# Patient Record
Sex: Male | Born: 1945 | Race: White | Hispanic: No | Marital: Married | Smoking: Never smoker
Health system: Southern US, Community
[De-identification: ages and names within clinical notes are randomized; demographics above are authoritative.]

## PROBLEM LIST (undated history)

## (undated) DIAGNOSIS — I480 Paroxysmal atrial fibrillation: Secondary | ICD-10-CM

## (undated) DIAGNOSIS — E785 Hyperlipidemia, unspecified: Secondary | ICD-10-CM

## (undated) DIAGNOSIS — I10 Essential (primary) hypertension: Secondary | ICD-10-CM

## (undated) DIAGNOSIS — IMO0001 Reserved for inherently not codable concepts without codable children: Secondary | ICD-10-CM

## (undated) DIAGNOSIS — G4730 Sleep apnea, unspecified: Secondary | ICD-10-CM

## (undated) DIAGNOSIS — D6851 Activated protein C resistance: Secondary | ICD-10-CM

## (undated) HISTORY — PX: HERNIA REPAIR: SHX51

## (undated) HISTORY — PX: PACEMAKER INSERTION: SHX728

---

## 2003-08-10 ENCOUNTER — Ambulatory Visit (HOSPITAL_COMMUNITY): Admission: RE | Admit: 2003-08-10 | Discharge: 2003-08-10 | Payer: Self-pay | Admitting: Family Medicine

## 2005-09-03 ENCOUNTER — Ambulatory Visit (HOSPITAL_BASED_OUTPATIENT_CLINIC_OR_DEPARTMENT_OTHER): Admission: RE | Admit: 2005-09-03 | Discharge: 2005-09-03 | Payer: Self-pay | Admitting: Family Medicine

## 2005-09-24 ENCOUNTER — Ambulatory Visit: Payer: Self-pay | Admitting: Internal Medicine

## 2006-07-04 ENCOUNTER — Ambulatory Visit (HOSPITAL_BASED_OUTPATIENT_CLINIC_OR_DEPARTMENT_OTHER): Admission: RE | Admit: 2006-07-04 | Discharge: 2006-07-04 | Payer: Self-pay | Admitting: Family Medicine

## 2006-07-08 ENCOUNTER — Ambulatory Visit: Payer: Self-pay | Admitting: Internal Medicine

## 2013-02-23 HISTORY — PX: CARDIAC CATHETERIZATION: SHX172

## 2013-03-04 ENCOUNTER — Inpatient Hospital Stay (HOSPITAL_COMMUNITY)
Admission: AD | Admit: 2013-03-04 | Discharge: 2013-03-08 | DRG: 243 | Disposition: A | Discharge status: Home / Self Care | Payer: Medicare Other | Attending: Cardiovascular Disease | Admitting: Cardiovascular Disease

## 2013-03-04 ENCOUNTER — Ambulatory Visit (HOSPITAL_COMMUNITY): Admit: 2013-03-04 | Payer: Self-pay | Admitting: Cardiovascular Disease

## 2013-03-04 ENCOUNTER — Encounter (HOSPITAL_COMMUNITY): Payer: Self-pay | Admitting: Physician Assistant

## 2013-03-04 ENCOUNTER — Encounter (HOSPITAL_COMMUNITY)
Admission: AD | Disposition: A | Discharge status: Home / Self Care | Payer: Self-pay | Source: Home / Self Care | Attending: Cardiovascular Disease

## 2013-03-04 ENCOUNTER — Encounter (HOSPITAL_COMMUNITY): Payer: Self-pay

## 2013-03-04 DIAGNOSIS — R079 Chest pain, unspecified: Secondary | ICD-10-CM

## 2013-03-04 DIAGNOSIS — I10 Essential (primary) hypertension: Secondary | ICD-10-CM | POA: Diagnosis present

## 2013-03-04 DIAGNOSIS — D682 Hereditary deficiency of other clotting factors: Secondary | ICD-10-CM | POA: Diagnosis present

## 2013-03-04 DIAGNOSIS — I4891 Unspecified atrial fibrillation: Secondary | ICD-10-CM | POA: Diagnosis present

## 2013-03-04 DIAGNOSIS — IMO0001 Reserved for inherently not codable concepts without codable children: Secondary | ICD-10-CM

## 2013-03-04 DIAGNOSIS — Z950 Presence of cardiac pacemaker: Secondary | ICD-10-CM

## 2013-03-04 DIAGNOSIS — E785 Hyperlipidemia, unspecified: Secondary | ICD-10-CM | POA: Diagnosis present

## 2013-03-04 DIAGNOSIS — R071 Chest pain on breathing: Secondary | ICD-10-CM

## 2013-03-04 DIAGNOSIS — I200 Unstable angina: Secondary | ICD-10-CM | POA: Diagnosis present

## 2013-03-04 DIAGNOSIS — I495 Sick sinus syndrome: Principal | ICD-10-CM | POA: Diagnosis present

## 2013-03-04 DIAGNOSIS — I480 Paroxysmal atrial fibrillation: Secondary | ICD-10-CM

## 2013-03-04 DIAGNOSIS — I4892 Unspecified atrial flutter: Secondary | ICD-10-CM | POA: Diagnosis present

## 2013-03-04 DIAGNOSIS — D6859 Other primary thrombophilia: Secondary | ICD-10-CM | POA: Diagnosis present

## 2013-03-04 HISTORY — DX: Hyperlipidemia, unspecified: E78.5

## 2013-03-04 HISTORY — DX: Essential (primary) hypertension: I10

## 2013-03-04 HISTORY — DX: Reserved for inherently not codable concepts without codable children: IMO0001

## 2013-03-04 HISTORY — DX: Paroxysmal atrial fibrillation: I48.0

## 2013-03-04 HISTORY — DX: Sleep apnea, unspecified: G47.30

## 2013-03-04 HISTORY — DX: Activated protein C resistance: D68.51

## 2013-03-04 HISTORY — PX: LEFT HEART CATHETERIZATION WITH CORONARY ANGIOGRAM: SHX5451

## 2013-03-04 LAB — COMPREHENSIVE METABOLIC PANEL
ALT: 22 U/L (ref 0–53)
AST: 21 U/L (ref 0–37)
Albumin: 3.1 g/dL — ABNORMAL LOW (ref 3.5–5.2)
Alkaline Phosphatase: 44 U/L (ref 39–117)
BUN: 16 mg/dL (ref 6–23)
CO2: 19 mEq/L (ref 19–32)
Calcium: 7.7 mg/dL — ABNORMAL LOW (ref 8.4–10.5)
Chloride: 110 mEq/L (ref 96–112)
Creatinine, Ser: 0.91 mg/dL (ref 0.50–1.35)
GFR calc Af Amer: >90 mL/min (ref >90)
GFR calc non Af Amer: 86 mL/min — ABNORMAL LOW (ref >90)
Glucose, Bld: 121 mg/dL — ABNORMAL HIGH (ref 70–99)
Potassium: 3.8 mEq/L (ref 3.5–5.1)
Sodium: 138 mEq/L (ref 135–145)
Total Bilirubin: 0.5 mg/dL (ref 0.3–1.2)
Total Protein: 5.8 g/dL — ABNORMAL LOW (ref 6.0–8.3)

## 2013-03-04 LAB — CBC
HCT: 40.6 % (ref 39.0–52.0)
Hemoglobin: 13.8 g/dL (ref 13.0–17.0)
MCH: 30.4 pg (ref 26.0–34.0)
MCHC: 34 g/dL (ref 30.0–36.0)
MCV: 89.4 fL (ref 78.0–100.0)
Platelets: 228 10*3/uL (ref 150–400)
RBC: 4.54 MIL/uL (ref 4.22–5.81)
RDW: 14.4 % (ref 11.5–15.5)
WBC: 13.3 10*3/uL — ABNORMAL HIGH (ref 4.0–10.5)

## 2013-03-04 LAB — TSH: TSH: 0.953 u[IU]/mL (ref 0.350–4.500)

## 2013-03-04 LAB — MRSA PCR SCREENING: MRSA by PCR: NEGATIVE

## 2013-03-04 LAB — POCT I-STAT, CHEM 8
Calcium, Ion: 1.13 mmol/L (ref 1.13–1.30)
Chloride: 110 mEq/L (ref 96–112)
Creatinine, Ser: 0.9 mg/dL (ref 0.50–1.35)
Glucose, Bld: 112 mg/dL — ABNORMAL HIGH (ref 70–99)
HCT: 42 % (ref 39.0–52.0)
Hemoglobin: 14.3 g/dL (ref 13.0–17.0)
Potassium: 3.9 mEq/L (ref 3.5–5.1)

## 2013-03-04 LAB — CK TOTAL AND CKMB (NOT AT ARMC)
CK, MB: 2.4 ng/mL (ref 0.3–4.0)
Relative Index: INVALID (ref 0.0–2.5)
Total CK: 61 U/L (ref 7–232)

## 2013-03-04 LAB — LIPID PANEL
Cholesterol: 112 mg/dL (ref 0–200)
HDL: 35 mg/dL — ABNORMAL LOW (ref >39)
LDL Cholesterol: 65 mg/dL (ref 0–99)
Total CHOL/HDL Ratio: 3.2 RATIO
Triglycerides: 60 mg/dL (ref <150)
VLDL: 12 mg/dL (ref 0–40)

## 2013-03-04 LAB — HEMOGLOBIN A1C
Hgb A1c MFr Bld: 5.3 % (ref <5.7)
Mean Plasma Glucose: 105 mg/dL (ref <117)

## 2013-03-04 LAB — D-DIMER, QUANTITATIVE: D-Dimer, Quant: <0.27 ug/mL-FEU (ref 0.00–0.48)

## 2013-03-04 LAB — TROPONIN I
Troponin I: <0.3 ng/mL (ref <0.30)
Troponin I: <0.3 ng/mL (ref <0.30)
Troponin I: <0.3 ng/mL (ref <0.30)

## 2013-03-04 LAB — APTT: aPTT: 38 seconds — ABNORMAL HIGH (ref 24–37)

## 2013-03-04 LAB — PROTIME-INR
INR: 2.1 — ABNORMAL HIGH (ref 0.00–1.49)
Prothrombin Time: 22.7 seconds — ABNORMAL HIGH (ref 11.6–15.2)

## 2013-03-04 SURGERY — LEFT HEART CATHETERIZATION WITH CORONARY ANGIOGRAM
Anesthesia: LOCAL

## 2013-03-04 MED ORDER — DILTIAZEM HCL 100 MG IV SOLR
5.0000 mg/h | INTRAVENOUS | Status: DC
  Filled 2013-03-04: qty 100

## 2013-03-04 MED ORDER — VERAPAMIL HCL 2.5 MG/ML IV SOLN
INTRAVENOUS | Status: AC
  Filled 2013-03-04: qty 2

## 2013-03-04 MED ORDER — SODIUM CHLORIDE 0.9 % IV SOLN
INTRAVENOUS | Status: AC
  Administered 2013-03-04: 10:00:00 via INTRAVENOUS

## 2013-03-04 MED ORDER — FLECAINIDE ACETATE 50 MG PO TABS
50.0000 mg | ORAL_TABLET | Freq: Two times a day (BID) | ORAL | Status: DC
  Administered 2013-03-04: 50 mg via ORAL
  Filled 2013-03-04 (×3): qty 1

## 2013-03-04 MED ORDER — SODIUM CHLORIDE 0.9 % IV SOLN
INTRAVENOUS | Status: AC
  Administered 2013-03-04: 10 mL/h via INTRAVENOUS

## 2013-03-04 MED ORDER — NITROGLYCERIN 0.4 MG SL SUBL: 0.4000 mg | SUBLINGUAL_TABLET | SUBLINGUAL | Status: DC | PRN

## 2013-03-04 MED ORDER — ACETAMINOPHEN 325 MG PO TABS: 650.0000 mg | ORAL_TABLET | ORAL | Status: DC | PRN

## 2013-03-04 MED ORDER — DILTIAZEM HCL 100 MG IV SOLR
5.0000 mg/h | INTRAVENOUS | Status: DC
  Administered 2013-03-04: 5 mg/h via INTRAVENOUS
  Filled 2013-03-04: qty 100

## 2013-03-04 MED ORDER — KETOROLAC TROMETHAMINE 30 MG/ML IJ SOLN: 15.0000 mg | Freq: Four times a day (QID) | INTRAMUSCULAR | Status: DC | PRN

## 2013-03-04 MED ORDER — FENOFIBRATE 160 MG PO TABS
160.0000 mg | ORAL_TABLET | Freq: Every day | ORAL | Status: DC
  Administered 2013-03-04 – 2013-03-07 (×3): 160 mg via ORAL
  Filled 2013-03-04 (×6): qty 1

## 2013-03-04 MED ORDER — ASPIRIN 300 MG RE SUPP
300.0000 mg | RECTAL | Status: AC
  Filled 2013-03-04: qty 1

## 2013-03-04 MED ORDER — ONDANSETRON HCL 4 MG/2ML IJ SOLN: 4.0000 mg | Freq: Four times a day (QID) | INTRAMUSCULAR | Status: DC | PRN

## 2013-03-04 MED ORDER — ASPIRIN 81 MG PO CHEW: 324.0000 mg | CHEWABLE_TABLET | ORAL | Status: AC

## 2013-03-04 MED ORDER — RIVAROXABAN 20 MG PO TABS
20.0000 mg | ORAL_TABLET | Freq: Every day | ORAL | Status: DC
  Administered 2013-03-04 – 2013-03-07 (×3): 20 mg via ORAL
  Filled 2013-03-04 (×5): qty 1

## 2013-03-04 MED ORDER — METOPROLOL TARTRATE 25 MG PO TABS
75.0000 mg | ORAL_TABLET | Freq: Two times a day (BID) | ORAL | Status: DC
  Administered 2013-03-04 – 2013-03-06 (×5): 75 mg via ORAL
  Filled 2013-03-04 (×8): qty 1

## 2013-03-04 MED ORDER — HEPARIN (PORCINE) IN NACL 2-0.9 UNIT/ML-% IJ SOLN
INTRAMUSCULAR | Status: AC
  Filled 2013-03-04: qty 1000

## 2013-03-04 MED ORDER — LIDOCAINE HCL (PF) 1 % IJ SOLN
INTRAMUSCULAR | Status: AC
  Filled 2013-03-04: qty 30

## 2013-03-04 MED ORDER — SIMVASTATIN 40 MG PO TABS
40.0000 mg | ORAL_TABLET | Freq: Every day | ORAL | Status: DC
  Administered 2013-03-04 – 2013-03-07 (×3): 40 mg via ORAL
  Filled 2013-03-04 (×5): qty 1

## 2013-03-04 NOTE — H&P (Signed)
Patrick Mccall is an 67 y.o. male.   Chief Complaint:  CP HPI: The patient is a 67 yo male with a history of Factor V Leiden deficiency, PAF, HLD, HTN.   He has been taking Xarelto since Aug 2013.  He exercises five times per week.  The patient had developed CP yesterday which was worse with deep inspiration and progressed to a point when it hurt with any breath.  When EMS arrived he was in Afib/flutter with a rapid VR.  EKG showed J-Point elevation and he was taken emergently to the cath lab.   He denies SOB, N, V, fever, diaphoresis, LEE, dizziness, abd pain.  Past Medical History  Diagnosis Date  . PAF (paroxysmal atrial fibrillation)   . Factor V Leiden deficiency   . HLD (hyperlipidemia)     No past surgical history on file.  Family History  Problem Relation Age of Onset  . Heart attack Mother   . Heart attack Father   . Heart attack Maternal Grandmother   . Heart attack Maternal Grandfather   . Heart attack Paternal Grandmother   . Heart attack Paternal Grandfather    Social History:  reports that he has never smoked. He does not have any smokeless tobacco history on file. He reports that he drinks about 0.5 ounces of alcohol per week. His drug history is not on file.  Allergies: No Known Allergies  Medications Prior to Admission  Medication Sig Dispense Refill  . acetaminophen (TYLENOL) 500 MG tablet Take 500 mg by mouth every 6 (six) hours as needed for pain.      . fenofibrate (TRICOR) 145 MG tablet Take 145 mg by mouth daily.      . Fish Oil-Cholecalciferol (FISH OIL + D3) 1200-1000 MG-UNIT CAPS Take 1,000 mg by mouth daily.      . metoprolol (LOPRESSOR) 50 MG tablet Take 50 mg by mouth 2 (two) times daily.      . Multiple Vitamins-Minerals (MULTIVITAMIN WITH MINERALS) tablet Take 1 tablet by mouth daily.      . Rivaroxaban (XARELTO) 20 MG TABS Take 20 mg by mouth daily with supper.      . simvastatin (ZOCOR) 40 MG tablet Take 40 mg by mouth every evening.         Results for orders placed during the hospital encounter of 03/04/13 (from the past 48 hour(s))  CBC     Status: Abnormal   Collection Time    03/04/13  8:00 AM      Result Value Range   WBC 13.3 (*) 4.0 - 10.5 K/uL   RBC 4.54  4.22 - 5.81 MIL/uL   Hemoglobin 13.8  13.0 - 17.0 g/dL   HCT 40.9  81.1 - 91.4 %   MCV 89.4  78.0 - 100.0 fL   MCH 30.4  26.0 - 34.0 pg   MCHC 34.0  30.0 - 36.0 g/dL   RDW 78.2  95.6 - 21.3 %   Platelets 228  150 - 400 K/uL   No results found.  Review of Systems  Constitutional: Negative for fever and diaphoresis.  HENT: Negative for congestion and sore throat.   Respiratory: Negative for shortness of breath.   Cardiovascular: Positive for chest pain. Negative for orthopnea, leg swelling and PND.  Gastrointestinal: Negative for nausea and vomiting.  Genitourinary: Negative for hematuria.  Musculoskeletal: Negative for myalgias.  Neurological: Negative for dizziness.  All other systems reviewed and are negative.    Temperature 98 F (36.7 C), temperature  source Oral, height 5\' 3"  (1.6 m), weight 139 lb 12.4 oz (63.4 kg). Physical Exam  Constitutional: He is oriented to person, place, and time. He appears well-developed and well-nourished. No distress.  HENT:  Head: Normocephalic and atraumatic.  Eyes: EOM are normal. Pupils are equal, round, and reactive to light.  Neck: Normal range of motion. Neck supple. No JVD present.  Cardiovascular: Normal rate, S1 normal and S2 normal.   No murmur heard. Pulses:      Radial pulses are 2+ on the left side.       Dorsalis pedis pulses are 2+ on the right side, and 2+ on the left side.  No carotid bruit.  TR band on right wrist.  Respiratory: Effort normal and breath sounds normal. No respiratory distress. He has no wheezes. He has no rales.  GI: Soft. Bowel sounds are normal. He exhibits no distension. There is no tenderness.  Musculoskeletal: He exhibits no edema.  Neurological: He is alert and  oriented to person, place, and time.  Skin: Skin is warm and dry.  Psychiatric: He has a normal mood and affect.     Assessment/Plan Principal Problem:   Chest pain on respiration Active Problems:   PAF (paroxysmal atrial fibrillation)   HLD (hyperlipidemia)   Factor V deficiency   HTN (hypertension)  Plan: The patient was taken emergently to the cath lab.  Will start home meds and increase lopressor to 75mg  twice daily.  Check d-dimer.  If positive, CTA of the chest.  CBC, BMET.  Toradol for chest pain.    HAGER, BRYAN 03/04/2013, 8:39 AM    Agree with note written by [facility]  PA flutter with atypical CP and anterior precordial j point elevation. STEMI was called. Pt presented to cath lab for urgent cath.  Runell Gess 03/04/2013 5:35 PM

## 2013-03-04 NOTE — H&P (Signed)
    Pt was reexamined and existing H & P reviewed. No changes found.  Runell Gess, MD [facility] 03/04/2013 6:59 AM

## 2013-03-04 NOTE — Progress Notes (Signed)
Huey Bienenstock, PA notified patients increased HR 150-160's, a flutter, with SHOB. New orders received.   Dawson Bills, RN

## 2013-03-04 NOTE — Progress Notes (Signed)
Notified Huey Bienenstock, PA of pt's rhythm now a.fib in 90-100's. Pt denies pain at this time.   Dawson Bills, RN

## 2013-03-04 NOTE — Plan of Care (Signed)
Problem: Phase I Progression Outcomes Goal: Anginal pain relieved Outcome: Completed/Met Date Met:  03/04/13 complete

## 2013-03-04 NOTE — Progress Notes (Signed)
Chaplain responded to page from Cath Lab that pt had arrived and wife was in waiting area. Found pt's wife and son in consult room 3. Chaplain provided emotional and spiritual support including words of encouragement and prayer. Pt's wife expressed appreciation for chaplain support.

## 2013-03-04 NOTE — Progress Notes (Signed)
Transferred to room 2922 with belongings, chart, and meds. No complaints at this time. Patients wife accompanied patient during transfer.    Dawson Bills, RN

## 2013-03-04 NOTE — CV Procedure (Signed)
Patrick Mccall is a 67 y.o. male    [id number] LOCATION:  FACILITY: [facility]  PHYSICIAN: Nanetta Batty, M.D. 14-Jul-1946   DATE OF PROCEDURE:  03/04/2013  DATE OF DISCHARGE:   CARDIAC CATHETERIZATION     History obtained from the patient. [name] is a 67 year old Caucasian male patient of Dr. Carrolyn Meiers [facility] in [place] who has a history of A. Fib and was started on Xarelto no one year ago for stroke prophylaxis. He developed chest pain yesterday which has been more pleuritic in nature. He called EMS this morning and when he arrived he was in rapid A. Fib/flutter with what appeared to be J-point elevation in the anterior precordial leads. Because of this a STEMI was called and the patient arrived by EMS for urgent cardiac catheterization.   PROCEDURE DESCRIPTION:    The patient was brought to the second floor [facility] lab in the postabsorptive state. He was not premedicated with. His right wrist was prepped and shaved in usual sterile fashion. Xylocaine 1% was used for local anesthesia. A 6 French sheath was inserted into the right radial  artery using standard Seldinger technique.the patient did not receive IV heparin bolus since he had taken his Xarelto  last evening. A 6 [place] TIG catheter and pigtail catheters were used for selective poor angiography and left ventriculography respectively. Visipaque was used for the entirety of the case. Retrograde aorta, left ventricular and pullback pressures were recorded.   HEMODYNAMICS:    AO SYSTOLIC/AO DIASTOLIC: 101/77   LV SYSTOLIC/LV DIASTOLIC: 103/25  ANGIOGRAPHIC RESULTS:   1. Left main; normal  2. LAD; normal 3. Left circumflex; codominant and normal.  4. Right coronary artery; codominant and normal 5. Left ventriculography; RAO left ventriculogram was performed using  25 mL of Visipaque dye at 12 mL/second. The overall LVEF estimated  60 %Without wall motion abnormalities  IMPRESSION:[name] had paroxysmal  atrial flutter that converted to sinus rhythm at the end of the case. He has essentially normal coronary arteries and normal left ventricular function. The sheath was removed and  A TR band is placed on the right wrist to chief patent hemostasis.His Xarelto will  be continued as will as  his current medications. His beta blocker will be adjusted. He will be observed in the step down unit overnight and his enzymes will be cycled. He left the Cath Lab in stable condition.  Runell Gess MD, [facility] 03/04/2013 7:33 AM

## 2013-03-05 ENCOUNTER — Encounter (HOSPITAL_COMMUNITY): Payer: Self-pay | Admitting: Anesthesiology

## 2013-03-05 DIAGNOSIS — I4891 Unspecified atrial fibrillation: Secondary | ICD-10-CM

## 2013-03-05 DIAGNOSIS — I4892 Unspecified atrial flutter: Secondary | ICD-10-CM

## 2013-03-05 DIAGNOSIS — D682 Hereditary deficiency of other clotting factors: Secondary | ICD-10-CM

## 2013-03-05 DIAGNOSIS — R071 Chest pain on breathing: Secondary | ICD-10-CM

## 2013-03-05 MED ORDER — ETOMIDATE 2 MG/ML IV SOLN
INTRAVENOUS | Status: AC
  Filled 2013-03-05: qty 20

## 2013-03-05 MED ORDER — LIDOCAINE HCL (CARDIAC) 20 MG/ML IV SOLN
INTRAVENOUS | Status: AC
  Filled 2013-03-05: qty 5

## 2013-03-05 MED ORDER — DOPAMINE-DEXTROSE 3.2-5 MG/ML-% IV SOLN
INTRAVENOUS | Status: AC
  Administered 2013-03-05: 3 ug/kg/min via INTRAVENOUS
  Filled 2013-03-05: qty 250

## 2013-03-05 MED ORDER — FLUMAZENIL 0.5 MG/5ML IV SOLN
INTRAVENOUS | Status: AC
  Filled 2013-03-05: qty 5

## 2013-03-05 MED ORDER — ROCURONIUM BROMIDE 50 MG/5ML IV SOLN
INTRAVENOUS | Status: AC
  Filled 2013-03-05: qty 2

## 2013-03-05 MED ORDER — SODIUM CHLORIDE 0.9 % IV SOLN: 250.0000 mL | INTRAVENOUS | Status: DC

## 2013-03-05 MED ORDER — SODIUM CHLORIDE 0.9 % IJ SOLN
3.0000 mL | Freq: Two times a day (BID) | INTRAMUSCULAR | Status: DC
  Administered 2013-03-05 – 2013-03-08 (×7): 3 mL via INTRAVENOUS

## 2013-03-05 MED ORDER — SODIUM CHLORIDE 0.9 % IJ SOLN: 3.0000 mL | INTRAMUSCULAR | Status: DC | PRN

## 2013-03-05 MED ORDER — SUCCINYLCHOLINE CHLORIDE 20 MG/ML IJ SOLN
INTRAMUSCULAR | Status: AC
  Filled 2013-03-05: qty 1

## 2013-03-05 MED ORDER — DOPAMINE-DEXTROSE 3.2-5 MG/ML-% IV SOLN: 3.0000 ug/kg/min | INTRAVENOUS | Status: DC

## 2013-03-05 NOTE — Progress Notes (Signed)
Pts ST Elevation subsided and pt no longer feels lightheaded. New order obtained for an echo in the AM. Gwynneth Macleod RNBSN

## 2013-03-05 NOTE — Progress Notes (Signed)
  Echocardiogram 2D Echocardiogram has been performed.  Patrick Mccall 03/05/2013, 3:27 PM

## 2013-03-05 NOTE — Progress Notes (Signed)
Sent page to Dr. Mayford Knife to report that the pt has had several short pauses and a couple long pauses the longest appearing to be 7 seconds long. Pacing pad placed on pt, and zol at the bedside. No new orders yet obtained. Gwynneth Macleod RN BSN

## 2013-03-05 NOTE — Progress Notes (Signed)
Called Dr. Mayford Knife to report changes in pts EKG, and the pts c/o lightheadedness. 12 lead EKG performed and walked over to Dr. In Cath Lab. Gwynneth Macleod RN BSN

## 2013-03-05 NOTE — Anesthesia Preprocedure Evaluation (Signed)
Anesthesia Evaluation  Patient identified by MRN, date of birth, ID band Patient awake    Reviewed: Allergy & Precautions, H&P , NPO status , Patient's Chart, lab work & pertinent test results  Airway       Dental   Pulmonary sleep apnea ,          Cardiovascular hypertension, + dysrhythmias Atrial Fibrillation     Neuro/Psych    GI/Hepatic   Endo/Other    Renal/GU      Musculoskeletal   Abdominal   Peds  Hematology   Anesthesia Other Findings   Reproductive/Obstetrics                           Anesthesia Physical Anesthesia Plan  ASA: III  Anesthesia Plan: General   Post-op Pain Management:    Induction: Intravenous  Airway Management Planned: Mask  Additional Equipment:   Intra-op Plan:   Post-operative Plan:   Informed Consent: I have reviewed the patients History and Physical, chart, labs and discussed the procedure including the risks, benefits and alternatives for the proposed anesthesia with the patient or authorized representative who has indicated his/her understanding and acceptance.     Plan Discussed with: CRNA, Anesthesiologist and Surgeon  Anesthesia Plan Comments:         Anesthesia Quick Evaluation

## 2013-03-05 NOTE — Consult Note (Signed)
ELECTROPHYSIOLOGY CONSULT NOTE  Patient ID: Patrick Mccall MRN: [id number], DOB/AGE: [birth year]   Admit date: 03/04/2013 Date of Consult: 03/05/2013  Primary Physician: Burnell Blanks, MD Primary Cardiologist: Nanetta Batty, MD Reason for Consultation: Atrial flutter  History of Present Illness Patrick Mccall is a pleasant 67 year old man with PAF, HTN, OSA and factor V Leiden deficiency who presented yesterday with chest pain and abnormal ECG with anterolateral ST elevation. He underwent cardiac catheterization which revealed normal coronary arteries and normal LV function. He also had rapid atrial flutter while in the cath lab. He was started on IV diltiazem in addition to flecainide and metoprolol. He developed bradycardia with pauses, longest was 8 seconds in duration. Most of these have occurred with brief conversion to SR; however, there are a few 4 second pauses while in flutter. IV diltiazem was discontinued today around 3:30 AM and he has not received flecainide or metoprolol today. He has persistent atrial flutter. Echo is pending.  Patrick Mccall reports he was diagnosed with AF 8 years ago. He was followed previously by Dr. Reyes Ivan. He hasn't seen a cardiologist in 5 years. Since then he has had intermittent "racing" palpitations that usually last 5-10 minutes and resolve without intervention. However, his episodes seem to be more frequent in the last few months. He was started on Xarelto in August 2013. He denies SOB/DOE. He denies dizziness, near syncope or syncope. He is quite active, exercising fives times weekly on the elliptical machine and treadmill without difficulty or limitation.   Past Medical History Past Medical History  Diagnosis Date  . PAF (paroxysmal atrial fibrillation)   . Factor V Leiden deficiency   . HLD (hyperlipidemia)   . Hypertension   . Sleep apnea     Past Surgical History Past Surgical History  Procedure Laterality Date  . Hernia repair       Allergies/Intolerances No Known Allergies  Inpatient Medications . fenofibrate  160 mg Oral Daily  . metoprolol tartrate  75 mg Oral BID  . rivaroxaban  20 mg Oral Q supper  . simvastatin  40 mg Oral q1800  . sodium chloride  3 mL Intravenous Q12H   . sodium chloride 10 mL/hr (03/04/13 1946)  . sodium chloride    . diltiazem (CARDIZEM) infusion Stopped (03/05/13 0330)  . DOPamine 3 mcg/kg/min (03/05/13 0546)   Family History Positive for CAD   Social History Social History  . Marital Status: Married   Social History Main Topics  . Smoking status: Never Smoker   . Smokeless tobacco: No  . Alcohol Use: .5 - 1 oz/week    1-2 drink(s) per week  . Drug Use: No   Social History Narrative   Exercises five times per week doing cardio.    Review of Systems General: No chills, fever, night sweats or weight changes  Cardiovascular:  No chest pain, dyspnea on exertion, edema, orthopnea, palpitations, paroxysmal nocturnal dyspnea Dermatological: No rash, lesions or masses Respiratory: No cough, dyspnea Urologic: No hematuria, dysuria Abdominal: No nausea, vomiting, diarrhea, bright red blood per rectum, melena, or hematemesis Neurologic: No visual changes, weakness, changes in mental status All other systems reviewed and are otherwise negative except as noted above.  Physical Exam Blood pressure 116/61, pulse 68, temperature 97.7 F (36.5 C), temperature source Oral, resp. rate 26, height 5\' 3"  (1.6 m), weight 139 lb 12.4 oz (63.4 kg), SpO2 99.00%.  General: Well developed, well appearing 67 year old male in no acute distress. HEENT: Normocephalic, atraumatic. EOMs  intact. Sclera nonicteric. Oropharynx clear.  Neck: Supple without bruits. No JVD. Lungs: Respirations regular and unlabored, CTA bilaterally. No wheezes, rales or rhonchi. Heart: Regular. S1, S2 present. No murmurs, rub, S3 or S4. Abdomen: Soft, non-tender, non-distended. BS present x 4 quadrants. No  hepatosplenomegaly.  Extremities: No clubbing, cyanosis or edema. DP/PT/Radials 2+ and equal bilaterally. Psych: Normal affect. Neuro: Alert and oriented X 3. Moves all extremities spontaneously. Musculoskeletal: No kyphosis. Skin: Intact. Warm and dry. No rashes or petechiae in exposed areas.   Labs  Recent Labs  03/04/13 0800 03/04/13 1000 03/04/13 1556 03/04/13 2240  CKTOTAL 61  --   --   --   CKMB 2.4  --   --   --   TROPONINI <0.30 <0.30 <0.30 <0.30   Lab Results  Component Value Date   WBC 13.3* 03/04/2013   HGB 13.8 03/04/2013   HCT 40.6 03/04/2013   MCV 89.4 03/04/2013   PLT 228 03/04/2013    Recent Labs Lab 03/04/13 0800  NA 138  K 3.8  CL 110  CO2 19  BUN 16  CREATININE 0.91  CALCIUM 7.7*  PROT 5.8*  BILITOT 0.5  ALKPHOS 44  ALT 22  AST 21  GLUCOSE 121*   Lab Results  Component Value Date   CHOL 112 03/04/2013   HDL 35* 03/04/2013   LDLCALC 65 03/04/2013   TRIG 60 03/04/2013   Lab Results  Component Value Date   DDIMER <0.27 03/04/2013    Recent Labs  03/04/13 0800  TSH 0.953    Recent Labs  03/04/13 0800  INR 2.10*    Radiology/Studies No results found.  Echocardiogram  pending  12-lead ECG on presentation (03/04/2013 07:58) shows SR at 82 bpm with anterolateral ST elevation Repeat 12-lead ECG (03/05/2013 01:00) shows typical appearing atrial flutter with 5:1 conduction at 49 bpm  Telemetry shows persistent atrial flutter; intermittent pauses, most on conversion to SR, longest 8 seconds; there are 2 pauses ~4 seconds in duration while in flutter   Assessment and Plan 1. Atrial flutter with intermittent bradycardia and post termination pauses 2.Atrial fibrillation, coarse with an RVR 2. Normal coronary arteries 3. Normal LV function 4. PAF, diagnosed 8 years ago  Patrick Mccall presents with symptomatic, typical appearing atrial flutter and post termination pauses. He also has PAF and this is demonstrated on tele as well as on 12 lead  ECG. Treatment options were reviewed in detail with him and his wife. These include medical therapy with rate/rhythm controlling medications +PPM and/or procedural treatment approach with EPS +RF ablation. Please see Dr. Lubertha Basque recommendations below.    Dr. Ladona Ridgel to see Signed, Rick Duff, PA-C 03/05/2013, 12:58 PM  EP Attending  Patient seen and examined. I have reviewed and amended the note where appropriate. The patient has atrial flutter, mostly, but also atrial fibrillation. He has had pauses which are probably in part associated with anti-arrhythmic drug therapy and cardizem. While catheter ablation of atrial flutter is a strong consideration, he would also need treatment of his atrial fibrillation which would be returning to anti-arrhythmic drug therap and the concern for worsening pauses. I have laid out the options for treatment. One option would be PPM and flecaininde and either a calcium channel or a beta blocker. A second option would be option one and atrial flutter ablation as atrial flutter tends to be refractory to flecainide or for that matter all anti-arrhythmic drugs. I have presented these options to the patient and he is reflecting on  his options.  Leonia Reeves.D.

## 2013-03-05 NOTE — Progress Notes (Signed)
Spoke with Dr. Mayford Knife about pts current rhythm and frequent pauses. New order obtained for dopamine, pacing pads on pt, pt NPO, and all AM meds are on hold. Pt is frequently converting back and forth into a sinus rhythm after the long pauses, and then quickly goes back into a flutter. Currently bp 111/36, HR 77, Resp 25, Sats 100% RA. Gwynneth Macleod RN BSN

## 2013-03-05 NOTE — Progress Notes (Signed)
Came to perform DCCV of Atrial Flutter -- & as I arrived, he converted to NSR that lasted ~5 min before going back into Aflutter with rates 100-150 again.  -- this makes attempting DCCV not a a good idea, as he will likely not maintain NSR.  With pauses - I am reluctant to use combination of Flecainide + CCB for rhythm control.  Will consult EP to discuss other options for rhythm control vs. Aflutter ablation.  Marykay Lex, M.D., M.S. [facility] [address] [place], [place]  [address]  [phone] Pager # [phone] 03/05/2013 10:53 AM

## 2013-03-05 NOTE — Progress Notes (Signed)
[facility]  Subjective: Currently asymptomatic. No complaints.    Objective: Vital signs in last 24 hours: Temp:  [97.8 F (36.6 C)-98.5 F (36.9 C)] 97.8 F (36.6 C) (06/11 0746) Pulse Rate:  [41-168] 90 (06/11 0746) Resp:  [8-30] 24 (06/11 0746) BP: (72-164)/(37-93) 96/51 mmHg (06/11 0746) SpO2:  [92 %-100 %] 98 % (06/11 0746) Weight:  [139 lb 12.4 oz (63.4 kg)] 139 lb 12.4 oz (63.4 kg) (06/11 0500)    Intake/Output from previous day: 06/10 0701 - 06/11 0700 In: 801.5 [P.O.:600; I.V.:201.5] Out: 1000 [Urine:1000] Intake/Output this shift:    Medications Current Facility-Administered Medications  Medication Dose Route Frequency Provider Last Rate Last Dose  . 0.9 %  sodium chloride infusion   Intravenous Continuous Wilburt Finlay, PA-C 10 mL/hr at 03/04/13 1946 10 mL/hr at 03/04/13 1946  . acetaminophen (TYLENOL) tablet 650 mg  650 mg Oral Q4H PRN Runell Gess, MD      . aspirin chewable tablet 324 mg  324 mg Oral NOW Wilburt Finlay, PA-C       Or  . aspirin suppository 300 mg  300 mg Rectal NOW Wilburt Finlay, PA-C      . diltiazem (CARDIZEM) 100 mg in dextrose 5 % 100 mL infusion  5-15 mg/hr Intravenous Titrated Wilburt Finlay, PA-C   2.5 mg/hr at 03/05/13 0310  . DOPamine (INTROPIN) 800 mg in dextrose 5 % 250 mL infusion  3 mcg/kg/min Intravenous Titrated Quintella Reichert, MD 3.6 mL/hr at 03/05/13 0546 3 mcg/kg/min at 03/05/13 0546  . fenofibrate tablet 160 mg  160 mg Oral Daily Wilburt Finlay, PA-C   160 mg at 03/04/13 1000  . flecainide (TAMBOCOR) tablet 50 mg  50 mg Oral Q12H Wilburt Finlay, PA-C   50 mg at 03/04/13 2253  . ketorolac (TORADOL) 30 MG/ML injection 15 mg  15 mg Intravenous Q6H PRN Wilburt Finlay, PA-C      . metoprolol tartrate (LOPRESSOR) tablet 75 mg  75 mg Oral BID Wilburt Finlay, PA-C   75 mg at 03/04/13 2253  . nitroGLYCERIN (NITROSTAT) SL tablet 0.4 mg  0.4 mg Sublingual Q5 Min x 3 PRN Wilburt Finlay, PA-C      . ondansetron [facility])  injection 4 mg  4 mg Intravenous Q6H PRN Runell Gess, MD      . Rivaroxaban [name]) tablet 20 mg  20 mg Oral Q supper Wilburt Finlay, PA-C   20 mg at 03/04/13 1804  . simvastatin (ZOCOR) tablet 40 mg  40 mg Oral q1800 Wilburt Finlay, PA-C   40 mg at 03/04/13 1804    PE: General appearance: alert, cooperative and no distress Lungs: clear to auscultation bilaterally Heart: regularly irregular rhythm Extremities: no LEE Pulses: 2+ and symmetric Skin: warm and dry Neurologic: Grossly normal  Lab Results:   Recent Labs  03/04/13 0732 03/04/13 0800  WBC  --  13.3*  HGB 14.3 13.8  HCT 42.0 40.6  PLT  --  228   BMET  Recent Labs  03/04/13 0732 03/04/13 0800  NA 144 138  K 3.9 3.8  CL 110 110  CO2  --  19  GLUCOSE 112* 121*  BUN 16 16  CREATININE 0.90 0.91  CALCIUM  --  7.7*   PT/INR  Recent Labs  03/04/13 0800  LABPROT 22.7*  INR 2.10*   Cholesterol  Recent Labs  03/04/13 0800  CHOL 112   Cardiac Panel (last 3 results)  Recent Labs  03/04/13 0800 03/04/13 1000  03/04/13 1556 03/04/13 2240  CKTOTAL 61  --   --   --   CKMB 2.4  --   --   --   TROPONINI <0.30 <0.30 <0.30 <0.30  RELINDX RELATIVE INDEX IS INVALID  --   --   --    Studies/Results: Diagnostic LHC 03/04/13 HEMODYNAMICS:  AO SYSTOLIC/AO DIASTOLIC: 101/77  LV SYSTOLIC/LV DIASTOLIC: 103/25  ANGIOGRAPHIC RESULTS:  1. Left main; normal  2. LAD; normal  3. Left circumflex; codominant and normal.  4. Right coronary artery; codominant and normal  5. Left ventriculography; RAO left ventriculogram was performed using  25 mL of Visipaque dye at 12 mL/second. The overall LVEF estimated  60 %Without wall motion abnormalities    Assessment/Plan  Principal Problem:   Chest pain on respiration Active Problems:   PAF (paroxysmal atrial fibrillation)   HLD (hyperlipidemia)   Factor V deficiency   HTN (hypertension)  Plan: Emergent LHC yesterday revealed normal coronaries and normal LV  function. Right radial access site is stable. Pt had several long pauses overnight, the longest appearing to be 7 seconds. Subsequently, he was placed on dopamine and pacer pads were placed. Currently, he continues to have a-flutter with rates fluctuating in the 80-120s. He continues to have intermittent pauses. He is currently asymptomatic, and hemodynamically stable. He has been on Xarelto for [facility]. Will try to attempt DCCV today. If not successful, pt will need a PPM. Dr. Royann Shivers is not in the hospital today. ? temporary pacer for now, if needed. MD to follow.     LOS: 1 day    Brittainy M. Delmer Islam 03/05/2013 8:01 AM  I have seen and evaluated the patient this AM along with Boyce Medici, PA. I agree with her findings, examination as well as impression recommendations.  Interesting scenario - presented with rapid A-flutter that likely potentiated his J-point elevation.  Was take to the cath lab - no significant disease noted.  Was placed on IV Diltiazem & Flecainde - overnight became hypotensive & was noted to have prolonged pauses (~7 sec).  Was placed on low dose Dopamine gtt & now has rates btw 90-150.  He feels a bit better this AM, but not at baseline.    He has been on Xarelto since Aug 2013 for combination of Afib & FActor V Leiden deficiency.   Besides his Aflutter wit variable block (irregular rhythm) & general appearance of "not feeling well", his exam is benign.  At this point, I think that DCCV may be the best course of action -- we will try to arrange this for today with either Anesthesiology or PCCM for sedation & airway management.  Would stop dopamine prior to DCCV  Will consult EP to consider potential Aflutter ablation.  Continue Xarelto & make NPO  I spent at least 15 minutes explaining our findings & plan of action. I explained the risks & benefits of DCCV to the patient & answered all ?s.  We will be standing by for need to temporarily externally pace +/-  Temp PM.    MD Time with pt: 20 min  Kenya Kook W, M.D., M.S. [facility] [address] [place], [place]  [address]  [phone] Pager # [phone] 03/05/2013 8:39 AM

## 2013-03-06 ENCOUNTER — Encounter (HOSPITAL_COMMUNITY)
Admission: AD | Disposition: A | Discharge status: Home / Self Care | Payer: Self-pay | Source: Home / Self Care | Attending: Cardiovascular Disease

## 2013-03-06 DIAGNOSIS — I10 Essential (primary) hypertension: Secondary | ICD-10-CM

## 2013-03-06 DIAGNOSIS — I4892 Unspecified atrial flutter: Secondary | ICD-10-CM

## 2013-03-06 DIAGNOSIS — I442 Atrioventricular block, complete: Secondary | ICD-10-CM

## 2013-03-06 DIAGNOSIS — E785 Hyperlipidemia, unspecified: Secondary | ICD-10-CM

## 2013-03-06 DIAGNOSIS — Z950 Presence of cardiac pacemaker: Secondary | ICD-10-CM | POA: Diagnosis not present

## 2013-03-06 HISTORY — PX: PERMANENT PACEMAKER INSERTION: SHX5480

## 2013-03-06 HISTORY — PX: ATRIAL FLUTTER ABLATION: SHX5733

## 2013-03-06 SURGERY — ATRIAL FLUTTER ABLATION
Anesthesia: LOCAL

## 2013-03-06 MED ORDER — HEPARIN (PORCINE) IN NACL 2-0.9 UNIT/ML-% IJ SOLN
INTRAMUSCULAR | Status: AC
  Filled 2013-03-06: qty 500

## 2013-03-06 MED ORDER — FENTANYL CITRATE 0.05 MG/ML IJ SOLN
INTRAMUSCULAR | Status: AC
  Filled 2013-03-06: qty 2

## 2013-03-06 MED ORDER — METOPROLOL TARTRATE 1 MG/ML IV SOLN
INTRAVENOUS | Status: AC
  Filled 2013-03-06: qty 5

## 2013-03-06 MED ORDER — SODIUM CHLORIDE 0.9 % IV SOLN
INTRAVENOUS | Status: DC
  Administered 2013-03-06: 11:00:00 via INTRAVENOUS

## 2013-03-06 MED ORDER — MIDAZOLAM HCL 5 MG/5ML IJ SOLN
INTRAMUSCULAR | Status: AC
  Filled 2013-03-06: qty 5

## 2013-03-06 MED ORDER — BUPIVACAINE HCL (PF) 0.25 % IJ SOLN
INTRAMUSCULAR | Status: AC
  Filled 2013-03-06: qty 60

## 2013-03-06 MED ORDER — LIDOCAINE HCL (PF) 1 % IJ SOLN
INTRAMUSCULAR | Status: AC
  Filled 2013-03-06: qty 60

## 2013-03-06 MED ORDER — SODIUM CHLORIDE 0.9 % IR SOLN
80.0000 mg | Status: DC
  Filled 2013-03-06: qty 2

## 2013-03-06 MED ORDER — DEXTROSE 5 % IV SOLN
INTRAVENOUS | Status: AC
  Filled 2013-03-06: qty 50

## 2013-03-06 MED ORDER — IBUTILIDE FUMARATE 1 MG/10ML IV SOLN
INTRAVENOUS | Status: AC
  Filled 2013-03-06: qty 10

## 2013-03-06 MED ORDER — FLECAINIDE ACETATE 50 MG PO TABS
150.0000 mg | ORAL_TABLET | Freq: Once | ORAL | Status: AC
  Administered 2013-03-06: 150 mg via ORAL
  Filled 2013-03-06: qty 1

## 2013-03-06 MED ORDER — CEFAZOLIN SODIUM-DEXTROSE 2-3 GM-% IV SOLR
2.0000 g | INTRAVENOUS | Status: DC
  Filled 2013-03-06: qty 50

## 2013-03-06 MED ORDER — MAGNESIUM SULFATE 40 MG/ML IJ SOLN
2.0000 g | Freq: Once | INTRAMUSCULAR | Status: AC
  Administered 2013-03-06: 2 g via INTRAVENOUS
  Filled 2013-03-06 (×2): qty 50

## 2013-03-06 NOTE — Progress Notes (Addendum)
Subjective: No dizziness or SOB  Objective: Vital signs in last 24 hours: Temp:  [97.5 F (36.4 C)-98.3 F (36.8 C)] 97.5 F (36.4 C) (06/12 0400) Pulse Rate:  [55-128] 100 (06/12 0000) Resp:  [8-26] 20 (06/12 0000) BP: (97-150)/(50-96) 149/85 mmHg (06/12 0400) SpO2:  [97 %-100 %] 99 % (06/12 0000) Weight:  [139 lb 5.3 oz (63.2 kg)] 139 lb 5.3 oz (63.2 kg) (06/12 0500)    Intake/Output from previous day: 06/11 0701 - 06/12 0700 In: 300.8 [P.O.:220; I.V.:80.8] Out: 850 [Urine:850] Intake/Output this shift:    Medications Current Facility-Administered Medications  Medication Dose Route Frequency Provider Last Rate Last Dose  . 0.9 %  sodium chloride infusion  250 mL Intravenous Continuous Brittainy Simmons, PA-C      . acetaminophen (TYLENOL) tablet 650 mg  650 mg Oral Q4H PRN Runell Gess, MD      . diltiazem (CARDIZEM) 100 mg in dextrose 5 % 100 mL infusion  5-15 mg/hr Intravenous Titrated Wilburt Finlay, PA-C   2.5 mg/hr at 03/05/13 0310  . DOPamine (INTROPIN) 800 mg in dextrose 5 % 250 mL infusion  3 mcg/kg/min Intravenous Titrated Quintella Reichert, MD 3.6 mL/hr at 03/05/13 0546 3 mcg/kg/min at 03/05/13 0546  . fenofibrate tablet 160 mg  160 mg Oral Daily Wilburt Finlay, PA-C   160 mg at 03/04/13 1000  . ketorolac (TORADOL) 30 MG/ML injection 15 mg  15 mg Intravenous Q6H PRN Wilburt Finlay, PA-C      . metoprolol tartrate (LOPRESSOR) tablet 75 mg  75 mg Oral BID Wilburt Finlay, PA-C   75 mg at 03/05/13 2108  . nitroGLYCERIN (NITROSTAT) SL tablet 0.4 mg  0.4 mg Sublingual Q5 Min x 3 PRN Wilburt Finlay, PA-C      . ondansetron [facility]) injection 4 mg  4 mg Intravenous Q6H PRN Runell Gess, MD      . Rivaroxaban [name]) tablet 20 mg  20 mg Oral Q supper Wilburt Finlay, PA-C   20 mg at 03/05/13 1731  . simvastatin (ZOCOR) tablet 40 mg  40 mg Oral q1800 Wilburt Finlay, PA-C   40 mg at 03/05/13 1731  . sodium chloride 0.9 % injection 3 mL  3 mL Intravenous Q12H Brittainy Simmons, PA-C   3  mL at 03/05/13 2109  . sodium chloride 0.9 % injection 3 mL  3 mL Intravenous PRN Robbie Lis, PA-C        PE: General appearance: alert, cooperative and no distress Lungs: clear to auscultation bilaterally Heart: irregularly irregular rhythm Extremities: No LEE Pulses: 2+ and symmetric Neurologic: Grossly normal  Lab Results:   Recent Labs  03/04/13 0732 03/04/13 0800  WBC  --  13.3*  HGB 14.3 13.8  HCT 42.0 40.6  PLT  --  228   BMET  Recent Labs  03/04/13 0732 03/04/13 0800  NA 144 138  K 3.9 3.8  CL 110 110  CO2  --  19  GLUCOSE 112* 121*  BUN 16 16  CREATININE 0.90 0.91  CALCIUM  --  7.7*   PT/INR  Recent Labs  03/04/13 0800  LABPROT 22.7*  INR 2.10*   Cholesterol  Recent Labs  03/04/13 0800  CHOL 112   Lipid Panel     Component Value Date/Time   CHOL 112 03/04/2013 0800   TRIG 60 03/04/2013 0800   HDL 35* 03/04/2013 0800   CHOLHDL 3.2 03/04/2013 0800   VLDL 12 03/04/2013 0800   LDLCALC 65 03/04/2013 0800  Assessment/Plan  Principal Problem:   Chest pain on respiration Active Problems:   PAF (paroxysmal atrial fibrillation)   HLD (hyperlipidemia)   Factor V deficiency   HTN (hypertension)  Plan:  Continues in aflutter/afib.  With borderline rate control.  EP consulted with recommendations.  The patient has decided on PPM which may be implanted today or tomorrow.  Keep NPO for now.  BP is stable.  Current meds: lopressor 75bid, xarelto   LOS: 2 days    HAGER, BRYAN 03/06/2013 7:48 AM  I have seen and evaluated the patient this AM along with Boyce Medici, PA. I agree with her findings, examination as well as impression recommendations.  I just talked with Dr. Ladona Ridgel -- very much appreciate his sagely input.  He just finished talking to the pt's son - who is a PA in [place].  With the combination of Afib (clearly seen on today's ECG & Flutter as well as medication related pauses is how to go about treating this.  I  agree that the best option would be Flutter ablation - as this is the more difficult arrythmia to treat medically, along with a Dual Chamber PPM to allow for aggressive rate / rhythm control Rx of AFib.    The plan is to proceed with this plan later this afternoon.  Continue Xarelto for [facility] & BB for rate control -- BP has been fine off of Dopamine.  Would probably add back flecainide or other antiarrhythmic agent post procedure - will defer to Dr. Ladona Ridgel following EPS.  Continue fenofibrate + statin for HLD per PCP regimen.   MD Time with pt: 5 min  Persia Lintner W, M.D., M.S. [facility] [address] [place], [place]  [address]  [phone] Pager # [phone] 03/06/2013 8:52 AM

## 2013-03-06 NOTE — Op Note (Signed)
EPS/RFA of atrial flutter followed by insertion of a DDD PPM without immediate complication. [id number].

## 2013-03-06 NOTE — Progress Notes (Signed)
Pacer pads removed to prep. the chest. Continue to monitor.

## 2013-03-06 NOTE — Progress Notes (Signed)
Patient ID: Patrick Mccall, male   DOB: 11/18/1945, 67 y.o.   MRN: [id number] Subjective:  C/o palpitations  Objective:  Vital Signs in the last 24 hours: Temp:  [97.5 F (36.4 C)-98.3 F (36.8 C)] 97.7 F (36.5 C) (06/12 0752) Pulse Rate:  [55-128] 103 (06/12 0752) Resp:  [8-26] 19 (06/12 0752) BP: (106-150)/(57-96) 133/96 mmHg (06/12 0752) SpO2:  [97 %-100 %] 99 % (06/12 0752) Weight:  [139 lb 5.3 oz (63.2 kg)] 139 lb 5.3 oz (63.2 kg) (06/12 0500)  Intake/Output from previous day: 06/11 0701 - 06/12 0700 In: 300.8 [P.O.:220; I.V.:80.8] Out: 850 [Urine:850] Intake/Output from this shift:    Physical Exam: Well appearing 67 yo man, NAD HEENT: Unremarkable Neck:  7 cm JVD, no thyromegally Lymphatics:  No adenopathy Back:  No CVA tenderness Lungs:  Clear with no wheezes HEART:  Regular rate rhythm, no murmurs, no rubs, no clicks Abd:  Flat, positive bowel sounds, no organomegally, no rebound, no guarding Ext:  2 plus pulses, no edema, no cyanosis, no clubbing Skin:  No rashes no nodules Neuro:  CN II through XII intact, motor grossly intact  Lab Results:  Recent Labs  03/04/13 0732 03/04/13 0800  WBC  --  13.3*  HGB 14.3 13.8  PLT  --  228    Recent Labs  03/04/13 0732 03/04/13 0800  NA 144 138  K 3.9 3.8  CL 110 110  CO2  --  19  GLUCOSE 112* 121*  BUN 16 16  CREATININE 0.90 0.91    Recent Labs  03/04/13 1556 03/04/13 2240  TROPONINI <0.30 <0.30   Hepatic Function Panel  Recent Labs  03/04/13 0800  PROT 5.8*  ALBUMIN 3.1*  AST 21  ALT 22  ALKPHOS 44  BILITOT 0.5    Recent Labs  03/04/13 0800  CHOL 112   No results found for this basename: PROTIME,  in the last 72 hours  Imaging: No results found.  Cardiac Studies: Tele - atrial fib with a RVR, atrial flutter with an RVR Assessment/Plan:  1. Atrial fib 2. Atrial flutter 3. Long post termination pauses Rec: I discussed case with the patient, his son (physician assistant) and  Dr. Herbie Baltimore. He has both atrial fib, flutter and long pauses. In order to control all of the above, I have recommended proceeding with atrial flutter ablation, PPM and initiation of flecainide/Calcium channel blocker. The risk/benefits/goals/expectation of all of the above have been discussed with the patient and his son and they wish to proceed.  LOS: 2 days    Gregg Taylor,M.D. 03/06/2013, 9:11 AM

## 2013-03-06 NOTE — Progress Notes (Signed)
Paged Dr Terressa Koyanagi about patient having a run v tach with 10 ectopic beats

## 2013-03-06 NOTE — Progress Notes (Signed)
To the EP lab by bed, stable.

## 2013-03-06 NOTE — Progress Notes (Signed)
Pt. still in the  EP lab.

## 2013-03-07 ENCOUNTER — Inpatient Hospital Stay (HOSPITAL_COMMUNITY): Payer: Medicare Other

## 2013-03-07 DIAGNOSIS — Z950 Presence of cardiac pacemaker: Secondary | ICD-10-CM

## 2013-03-07 DIAGNOSIS — I4892 Unspecified atrial flutter: Secondary | ICD-10-CM | POA: Diagnosis present

## 2013-03-07 MED ORDER — METOPROLOL TARTRATE 50 MG PO TABS
50.0000 mg | ORAL_TABLET | Freq: Two times a day (BID) | ORAL | Status: DC
  Filled 2013-03-07 (×2): qty 1

## 2013-03-07 MED ORDER — ONDANSETRON HCL 4 MG/2ML IJ SOLN: 4.0000 mg | Freq: Four times a day (QID) | INTRAMUSCULAR | Status: DC | PRN

## 2013-03-07 MED ORDER — DILTIAZEM HCL 25 MG/5ML IV SOLN
10.0000 mg | Freq: Once | INTRAVENOUS | Status: AC
  Administered 2013-03-07: 10 mg via INTRAVENOUS
  Filled 2013-03-07: qty 5

## 2013-03-07 MED ORDER — METOPROLOL TARTRATE 50 MG PO TABS
75.0000 mg | ORAL_TABLET | Freq: Two times a day (BID) | ORAL | Status: DC
  Administered 2013-03-07 – 2013-03-08 (×3): 75 mg via ORAL
  Filled 2013-03-07 (×4): qty 1

## 2013-03-07 MED ORDER — RIVAROXABAN 20 MG PO TABS: 20.0000 mg | ORAL_TABLET | Freq: Every day | ORAL | Status: DC

## 2013-03-07 MED ORDER — FLECAINIDE ACETATE 100 MG PO TABS
100.0000 mg | ORAL_TABLET | Freq: Two times a day (BID) | ORAL | Status: DC
  Administered 2013-03-07 – 2013-03-08 (×3): 100 mg via ORAL
  Filled 2013-03-07 (×4): qty 1

## 2013-03-07 MED ORDER — DEXTROSE 5 % IV SOLN
1.5000 g | Freq: Four times a day (QID) | INTRAVENOUS | Status: AC
  Administered 2013-03-07 (×3): 1.5 g via INTRAVENOUS
  Filled 2013-03-07 (×3): qty 15

## 2013-03-07 MED ORDER — SIMVASTATIN 40 MG PO TABS: 40.0000 mg | ORAL_TABLET | Freq: Every evening | ORAL | Status: DC

## 2013-03-07 MED ORDER — FENOFIBRATE 54 MG PO TABS: 54.0000 mg | ORAL_TABLET | Freq: Every day | ORAL | Status: DC

## 2013-03-07 MED ORDER — ACETAMINOPHEN 325 MG PO TABS: 325.0000 mg | ORAL_TABLET | ORAL | Status: DC | PRN

## 2013-03-07 NOTE — Progress Notes (Signed)
[facility]  Subjective: No chest pain or SOB. No complaints.  Objective: Vital signs in last 24 hours: Temp:  [97.7 F (36.5 C)-98.8 F (37.1 C)] 98.8 F (37.1 C) (06/13 0732) Pulse Rate:  [49-103] 79 (06/13 0732) Resp:  [8-25] 25 (06/13 0732) BP: (118-178)/(75-111) 152/84 mmHg (06/13 0700) SpO2:  [96 %-100 %] 99 % (06/13 0732) Weight:  [140 lb 10.5 oz (63.8 kg)] 140 lb 10.5 oz (63.8 kg) (06/13 0431)    Intake/Output from previous day: 06/12 0701 - 06/13 0700 In: 1018 [P.O.:200; I.V.:753; IV Piggyback:65] Out: 900 [Urine:900] Intake/Output this shift:    Medications Current Facility-Administered Medications  Medication Dose Route Frequency Provider Last Rate Last Dose  . 0.9 %  sodium chloride infusion  250 mL Intravenous Continuous Brittainy Simmons, PA-C      . acetaminophen (TYLENOL) tablet 325-650 mg  325-650 mg Oral Q4H PRN Marinus Maw, MD      . ceFAZolin (ANCEF) 1.5 g in dextrose 5 % 50 mL IVPB  1.5 g Intravenous Q6H Marinus Maw, MD   1.5 g at 03/07/13 0547  . DOPamine (INTROPIN) 800 mg in dextrose 5 % 250 mL infusion  3 mcg/kg/min Intravenous Titrated Quintella Reichert, MD 3.6 mL/hr at 03/05/13 0546 3 mcg/kg/min at 03/05/13 0546  . fenofibrate tablet 160 mg  160 mg Oral Daily Wilburt Finlay, PA-C   160 mg at 03/06/13 0934  . flecainide (TAMBOCOR) tablet 100 mg  100 mg Oral Q12H Marinus Maw, MD      . ketorolac (TORADOL) 30 MG/ML injection 15 mg  15 mg Intravenous Q6H PRN Wilburt Finlay, PA-C      . metoprolol (LOPRESSOR) tablet 50 mg  50 mg Oral BID Marinus Maw, MD      . nitroGLYCERIN (NITROSTAT) SL tablet 0.4 mg  0.4 mg Sublingual Q5 Min x 3 PRN Wilburt Finlay, PA-C      . ondansetron [facility]) injection 4 mg  4 mg Intravenous Q6H PRN Runell Gess, MD      . Rivaroxaban [name]) tablet 20 mg  20 mg Oral Q supper Wilburt Finlay, PA-C   20 mg at 03/05/13 1731  . simvastatin (ZOCOR) tablet 40 mg  40 mg Oral q1800 Wilburt Finlay, PA-C   40  mg at 03/05/13 1731  . sodium chloride 0.9 % injection 3 mL  3 mL Intravenous Q12H Brittainy Simmons, PA-C   3 mL at 03/06/13 2122  . sodium chloride 0.9 % injection 3 mL  3 mL Intravenous PRN Robbie Lis, PA-C        PE: General appearance: alert, cooperative and no distress Lungs: clear to auscultation bilaterally Heart: irregularly irregular rhythm Extremities: no LEE Pulses: 2+ and symmetric Skin: warm and dry Neurologic: Grossly normal  Lab Results:   Recent Labs  03/04/13 0800  WBC 13.3*  HGB 13.8  HCT 40.6  PLT 228   BMET  Recent Labs  03/04/13 0800  NA 138  K 3.8  CL 110  CO2 19  GLUCOSE 121*  BUN 16  CREATININE 0.91  CALCIUM 7.7*   PT/INR  Recent Labs  03/04/13 0800  LABPROT 22.7*  INR 2.10*   Cholesterol  Recent Labs  03/04/13 0800  CHOL 112   Studies/Results: Post-op CXR pending  Assessment/Plan   Principal Problem:   Chest pain on respiration Active Problems:   PAF (paroxysmal atrial fibrillation)   HLD (hyperlipidemia)   Factor V deficiency   HTN (hypertension)  Plan: S/P flutter ablation and insertion of a Environmental manager DDD PPM, by Dr. Ladona Ridgel. POD#1. Denies chest pain and SOB. Post-operative CXR pending. Will need to ensure proper lead placement and no signs of pneumothorax. Device interrogation showed normal functioning. Dr. Ladona Ridgel will follow PPM. Pt continues in a-fib with rates fluctuating in the 90s-130s. There is room for addition/upward titration of rate control meds with BP. SBP is in the 150s. Will give 10 mg IV Diltiazem and will increase Lopressor to 75 mg BID. Continue with flecainide.  Dr. Herbie Baltimore to assess and will provide further recommendation.    LOS: 3 days   Brittainy M. Delmer Islam 03/07/2013 7:51 AM  I have seen and evaluated the patient this AM along with Boyce Medici, PA. I agree with her findings, examination as well as impression recommendations.  S/p Flutter ablation & PPM -- now with  persistent Afib RVR.  Flecainide restarted yesterday as was BB 50 mg bid  Will dose IV CCB 10 mg now & increase BB to 75 mg bid.  May also need CCB PO as well.  Has BP room.  Not sure if DCCV would be effective as he has proven to go in & out.  Need longer load of antiarrythmic  Will see what EP recommends re additional rate/rhythm control  Once HR controlled, could consider d/c in next 1-2 days.  On his way for CXR this AM.  MD Time with pt: 5 min  HARDING,DAVID W, M.D., M.S. [facility] [address] [place], [place]  [address]  [phone] Pager # [phone] 03/07/2013 8:15 AM

## 2013-03-07 NOTE — Op Note (Signed)
Patrick Mccall, Patrick Mccall NO.:  [id number]  MEDICAL RECORD NO.:  [id number]  LOCATION:  2922                         FACILITY:  [facility]  PHYSICIAN:  Doylene Canning. Ladona Ridgel, MD    DATE OF BIRTH:  05/20/1946  DATE OF PROCEDURE:  03/06/2013 DATE OF DISCHARGE:                              OPERATIVE REPORT   PROCEDURE PERFORMED:  Electrophysiologic study and radiofrequency catheter ablation of atrial flutter followed by insertion of a dual- chamber pacemaker.  INDICATIONS:  This is symptomatic tachy-brady syndrome with symptomatic atrial flutter refractory to medical therapy with flecainide, atrial fibrillation, and post-termination pauses of 7 seconds and persistent sinus bradycardia requiring dopamine.  INTRODUCTION:  The patient is a 67 year old man who was admitted to the hospital with rapid atrial fibrillation and flutter.  He was treated with flecainide therapy and intravenous calcium channel blockers.  He then subsequently developed prolonged asystole with over 7 second pause and then sinus bradycardia requiring intravenous dopamine secondary to heart rates in the 30s.  He has then had persistent atrial fibrillation and flutter at rates of over 100 beats per minute despite medical therapy.  After discussion of the possible treatment options with the patient, including catheter ablation, pacemaker insertion, and medical therapy, but his rhythm control with backup pacing and catheter ablation of his atrial flutter.  PROCEDURE:  After informed was obtained, the patient was taken to the diagnostic EP lab in a fasting state.  After usual preparation and draping, intravenous fentanyl and midazolam was given for sedation.  A 6- [place] hexapolar catheter was inserted percutaneously in the right jugular vein and advanced to the coronary sinus.  A 6-French quadripolar catheter was inserted percutaneously in the right femoral vein and advanced to the His bundle region.  A  7-French quadripolar ablation catheter was inserted percutaneously in the right femoral vein and advanced to the right atrium.  Intravenous magnesium and ibutilide were given to the patient and he reverted to sinus rhythm.  Rapid pacing was carried out demonstrating intact atrial flutter isthmus conduction.  The ablation catheter was then maneuvered into the atrial flutter isthmus. The patient underwent 3 RF energy applications to the atrial flutter isthmus.  During RF energy application, the patient had spontaneous atrial fibrillation, which would spontaneously self terminate.  During periods of sinus rhythm at the end of the ablation, the stemmed A interval was approximately 150 milliseconds between 140-150 milliseconds.  It was deemed that this was consistent with atrial flutter isthmus block.  Rapid atrial pacing would result in the initiation of atrial fibrillation.  Rapid ventricular pacing demonstrated VA conduction, although rapid ventricular pacing would result in the initiation of atrial fibrillation.  As such additional assessment of the patient's AV nodal conduction both antegrade and retrograde could not be carried out.  After observation for 15 minutes with persistent atrial flutter isthmus block, the patient was referred rather was prepped rather for pacemaker insertion.  After the patient was given additional intravenous fentanyl and midazolam, he was prepped and draped in the usual manner.  A 30 mL of lidocaine was infiltrated into the left infraclavicular region.  A 5-cm incision was carried out over this region.  Electrocautery was utilized  to dissect down to the fascial plane.  The left subclavian vein was punctured x2 and the [employer] Dextrus model [phone] active fixation pacing lead, serial #[id number] was advanced into the right ventricle.  The [employer] Dextrus model #4135 active fixation pacing lead, serial #[id number] was advanced to the right atrium.   Mapping was carried out in the right atrium.  The right ventricle appeared at the final site, the R-waves were 14, the P-waves were 1/2, the threshold was 760 ohms in the atrium, and the impedance was 760 ohms in the ventricle and 600 ohms in the atrium.  The ventricular threshold was 0.6 V at 0.4 milliseconds.  Attempts to obtain an atrial threshold that were notable for a threshold less than 2 with the patient had atrial fibrillation during atrial threshold testing.  With these satisfactory parameters, the leads were secured to the subpectoral fascia with a figure-of-eight silk suture.  The sewing sleeve was secured with silk suture. Electrocautery was utilized to make a subcutaneous pocket.  Antibiotic irrigation was utilized to irrigate the pocket.  Electrocautery was utilized to assure hemostasis.  The [employer] Advantio dual- chamber pacemaker, serial [id number] was connected to the atrial and RV leads and placed back in the subcutaneous pocket.  The pocket was irrigated with additional antibiotic irrigation.  The incision was closed with 2-0 and 3-0 Vicryl.  Benzoin and Steri-Strips were painted on the skin.  A pressure dressing was applied and the patient was returned to his room in satisfactory condition.  COMPLICATIONS:  There were no immediate procedure complications.  RESULTS:  This demonstrates successful implantation of a [employer] dual-chamber pacemaker in a patient with symptomatic bradycardia and due to sinus node dysfunction.     Doylene Canning. Ladona Ridgel, MD     GWT/MEDQ  D:  03/06/2013  T:  03/07/2013  Job:  [id number]  cc:   Landry Corporal, MD

## 2013-03-07 NOTE — Progress Notes (Signed)
Patient ID: Patrick Mccall, male   DOB: 1946/07/26, 67 y.o.   MRN: [id number] Subjective:  Stable after atrial flutter ablation and insertion of a DDD PPM. He has had mild palpitations and ECG demonstrating atrial fib/flutter.  Objective:  Vital Signs in the last 24 hours: Temp:  [97.9 F (36.6 C)-98.8 F (37.1 C)] 98 F (36.7 C) (06/13 1213) Pulse Rate:  [49-83] 74 (06/13 1213) Resp:  [8-25] 17 (06/13 1213) BP: (114-178)/(67-111) 114/67 mmHg (06/13 1213) SpO2:  [96 %-100 %] 98 % (06/13 1213) Weight:  [140 lb 10.5 oz (63.8 kg)] 140 lb 10.5 oz (63.8 kg) (06/13 0431)  Intake/Output from previous day: 06/12 0701 - 06/13 0700 In: 1018 [P.O.:200; I.V.:753; IV Piggyback:65] Out: 1100 [Urine:1100] Intake/Output from this shift: Total I/O In: 538 [P.O.:400; I.V.:73; IV Piggyback:65] Out: -   Physical Exam: Well appearing middle aged man, NAD HEENT: Unremarkable Neck:  No JVD, no thyromegally Lungs:  Clear with no wheezes HEART:  Regular rate rhythm, no murmurs, no rubs, no clicks Abd:  Flat, positive bowel sounds, no organomegally, no rebound, no guarding Ext:  2 plus pulses, no edema, no cyanosis, no clubbing Skin:  No rashes no nodules Neuro:  CN II through XII intact, motor grossly intact  Lab Results: No results found for this basename: WBC, HGB, PLT,  in the last 72 hours No results found for this basename: NA, K, CL, CO2, GLUCOSE, BUN, CREATININE,  in the last 72 hours  Recent Labs  03/04/13 1556 03/04/13 2240  TROPONINI <0.30 <0.30   Hepatic Function Panel No results found for this basename: PROT, ALBUMIN, AST, ALT, ALKPHOS, BILITOT, BILIDIR, IBILI,  in the last 72 hours No results found for this basename: CHOL,  in the last 72 hours No results found for this basename: PROTIME,  in the last 72 hours  Imaging: Dg Chest 2 View  03/07/2013   *RADIOLOGY REPORT*  Clinical Data: Post pacemaker  CHEST - 2 VIEW  Comparison: None  Findings: Left subclavian transvenous  pacemaker leads project at right atrium and right ventricle. Upper normal heart size. Normal mediastinal contours and pulmonary vascularity. Changes of COPD with bibasilar effusions and atelectasis. Upper lungs clear. No pneumothorax. No acute osseous findings.  IMPRESSION: COPD with bibasilar pleural effusions and atelectasis. No pneumothorax following pacemaker insertion.   Original Report Authenticated By: Ulyses Southward, M.D.    Cardiac Studies: Tele - nsr with atrial fib/flutter Assessment/Plan:  1. Atrial fib - continue flecainide and metoprolol. I suspect he will have improve once his flecainide dose is at steady state. 2. Bradycardia - s/p PPM with adequate rate support 3. Atrial flutter - s/p ablation Rec: ok for discharge tomorrow. If HR still above 100 tomorrow (currently 70-80) would uptitrate beta blocker to a dose of up to 100 mg twice daily before considering a calcium channel blocker. He will need followup with me in arrhythmia clinic. I will arrange.  LOS: 3 days    Burnett Spray,M.D. 03/07/2013, 2:24 PM

## 2013-03-08 DIAGNOSIS — IMO0001 Reserved for inherently not codable concepts without codable children: Secondary | ICD-10-CM

## 2013-03-08 LAB — BASIC METABOLIC PANEL
Calcium: 8.9 mg/dL (ref 8.4–10.5)
GFR calc Af Amer: >90 mL/min (ref >90)
GFR calc non Af Amer: 85 mL/min — ABNORMAL LOW (ref >90)
Potassium: 4.1 mEq/L (ref 3.5–5.1)
Sodium: 134 mEq/L — ABNORMAL LOW (ref 135–145)

## 2013-03-08 LAB — CBC
MCH: 30.2 pg (ref 26.0–34.0)
MCHC: 34.4 g/dL (ref 30.0–36.0)
Platelets: 264 10*3/uL (ref 150–400)
RDW: 13.8 % (ref 11.5–15.5)

## 2013-03-08 MED ORDER — FLECAINIDE ACETATE 100 MG PO TABS: 100.0000 mg | ORAL_TABLET | Freq: Two times a day (BID) | ORAL | Status: DC

## 2013-03-08 MED ORDER — METOPROLOL TARTRATE 25 MG PO TABS: 75.0000 mg | ORAL_TABLET | Freq: Two times a day (BID) | ORAL | Status: DC

## 2013-03-08 NOTE — Discharge Summary (Signed)
Patient ID: Patrick Mccall,  MRN: [id number], DOB/AGE: [birth year] 67 y.o.  Admit date: 03/04/2013 Discharge date: 03/08/2013  Primary Care Provider: Dr Burnell Blanks Primary Cardiologist: Dr Royann Shivers  Discharge Diagnoses Principal Problem:   Unstable angina- chest pain and EKG changes in the setting of rapid AF Active Problems:   Normal coronary arteries at urgent cath 03/04/13   Atrial flutter with rapid ventricular response - s/p RF Ablation 03/06/2012   S/P placement of cardiac pacemaker- BS Guident 03/06/13   HLD (hyperlipidemia)   Factor V deficiency   HTN (hypertension)    Procedures: Urgent Cath 03/04/13                        RFA for A flutter 03/06/13                        PTVDP- BS- 03/06/13  Hospital Course:   The patient is a 67 yo male with a history of Factor V Leiden deficiency, PAF, HLD, HTN. He has been taking Xarelto since Aug 2013. He exercises five times per week. The patient had developed CP yesterday which was worse with deep inspiration and progressed to a point when it hurt with any breath. When EMS arrived he was in Afib/flutter with a rapid VR. EKG showed J-Point elevation and he was taken emergently to the cath lab. He had normal coronaries and NL LVF. He converted spontaeously to NSR after the cath. That night he had pauses and PAF on telemetry. He was put on Dopamine and transferred to [facility]. He was seen in consult by Dr Ladona Ridgel and after discussion with the pt and his family it was decided to proceed with RFS and PTVDP placement. This was done 03/06/13 without complication. He received a BS device. Dr Royann Shivers feels he can be discharged 03/08/13. He is on Flecanide 100mg  BID. We will see him for a site check in a week. Dr Ladona Ridgel has made an appointment to see him in Sept.    Discharge Vitals:  Blood pressure 115/68, pulse 83, temperature 97.9 F (36.6 C), temperature source Oral, resp. rate 17, height 5\' 3"  (1.6 m), weight 61.7 kg (136 lb 0.4 oz), SpO2 98.00%.     Labs: Results for orders placed during the hospital encounter of 03/04/13 (from the past 48 hour(s))  CBC     Status: Abnormal   Collection Time    03/08/13  6:04 AM      Result Value Range   WBC 10.6 (*) 4.0 - 10.5 K/uL   RBC 4.86  4.22 - 5.81 MIL/uL   Hemoglobin 14.7  13.0 - 17.0 g/dL   HCT 40.9  81.1 - 91.4 %   MCV 87.9  78.0 - 100.0 fL   MCH 30.2  26.0 - 34.0 pg   MCHC 34.4  30.0 - 36.0 g/dL   RDW 78.2  95.6 - 21.3 %   Platelets 264  150 - 400 K/uL  BASIC METABOLIC PANEL     Status: Abnormal   Collection Time    03/08/13  6:04 AM      Result Value Range   Sodium 134 (*) 135 - 145 mEq/L   Potassium 4.1  3.5 - 5.1 mEq/L   Chloride 102  96 - 112 mEq/L   CO2 24  19 - 32 mEq/L   Glucose, Bld 104 (*) 70 - 99 mg/dL   BUN 23  6 - 23 mg/dL   Creatinine, Ser  0.94  0.50 - 1.35 mg/dL   Calcium 8.9  8.4 - 09.8 mg/dL   GFR calc non Af Amer 85 (*) >90 mL/min   GFR calc Af Amer >90  >90 mL/min   Comment:            The eGFR has been calculated     using the CKD EPI equation.     This calculation has not been     validated in all clinical     situations.     eGFR's persistently     <90 mL/min signify     possible Chronic Kidney Disease.    Disposition:  Follow-up Information   Follow up with Lewayne Bunting, MD On 06/12/2013. (At 10:45 AM)    Contact information:   [facility] [address] [place] [address] [phone]      Follow up with Abelino Derrick, PA-C. (office will call you)    Contact information:   [address] [place] [address] [phone]       Discharge Medications:    Medication List    TAKE these medications       acetaminophen 500 MG tablet  Commonly known as:  TYLENOL  Take 500 mg by mouth every 6 (six) hours as needed for pain.     fenofibrate 145 MG tablet  Commonly known as:  TRICOR  Take 145 mg by mouth daily.     FISH OIL + D3 1200-1000 MG-UNIT Caps  Take 1,000 mg by mouth daily.      flecainide 100 MG tablet  Commonly known as:  TAMBOCOR  Take 1 tablet (100 mg total) by mouth every 12 (twelve) hours.     metoprolol tartrate 25 MG tablet  Commonly known as:  LOPRESSOR  Take 3 tablets (75 mg total) by mouth 2 (two) times daily.     multivitamin with minerals tablet  Take 1 tablet by mouth daily.     simvastatin 40 MG tablet  Commonly known as:  ZOCOR  Take 40 mg by mouth every evening.     XARELTO 20 MG Tabs  Generic drug:  Rivaroxaban  Take 20 mg by mouth daily with supper.         Duration of Discharge Encounter: Greater than 30 minutes including physician time.  Jolene Provost PA-C 03/08/2013 11:19 AM

## 2013-03-08 NOTE — Progress Notes (Signed)
Pt watched videos on"life with a  Pacemaker" and electrophysiology

## 2013-03-08 NOTE — Op Note (Signed)
[name], [name] NO.:  [id number]  MEDICAL RECORD NO.:  [id number]  LOCATION:  2922                         FACILITY:  [facility]  PHYSICIAN:  Doylene Canning. Ladona Ridgel, MD    DATE OF BIRTH:  Oct 31, 1945  DATE OF PROCEDURE:  03/07/2013 DATE OF DISCHARGE:                              OPERATIVE REPORT   PROCEDURE PERFORMED:  Electrophysiologic study and radiofrequency catheter ablation of atrial flutter followed by insertion of a dual- chamber pacemaker.  INDICATION:  The patient is a very pleasant 67 year old man who is admitted to hospital with symptomatic atrial fibrillation with a rapid ventricular response.   CANCELLED DICTATION.     Doylene Canning. Ladona Ridgel, MD     GWT/MEDQ  D:  03/07/2013  T:  03/08/2013  Job:  [id number]

## 2013-03-08 NOTE — Progress Notes (Signed)
D/C instructions reviewed w/Pt and family .Understanding verbalized.

## 2013-03-08 NOTE — Progress Notes (Signed)
[facility]  DAILY PROGRESS NOTE   Subjective:  Maintaining SR. Feels well. No pain or bleeding at site, no arrhythmia overnight.  Objective:  Temp:  [97.9 F (36.6 C)-98.7 F (37.1 C)] 97.9 F (36.6 C) (06/14 0753) Pulse Rate:  [74-94] 83 (06/14 0358) Resp:  [17-24] 17 (06/14 0753) BP: (114-141)/(67-82) 115/68 mmHg (06/14 0753) SpO2:  [95 %-98 %] 98 % (06/14 0753) Weight:  [136 lb 0.4 oz (61.7 kg)] 136 lb 0.4 oz (61.7 kg) (06/14 0500) Weight change: -4 lb 10.1 oz (-2.1 kg)  Intake/Output from previous day: 06/13 0701 - 06/14 0700 In: 1096 [P.O.:850; I.V.:116; IV Piggyback:130] Out: 2525 [Urine:2525]  Intake/Output from this shift:    Medications: Current Facility-Administered Medications  Medication Dose Route Frequency Provider Last Rate Last Dose  . 0.9 %  sodium chloride infusion  250 mL Intravenous Continuous Brittainy Simmons, PA-C      . acetaminophen (TYLENOL) tablet 325-650 mg  325-650 mg Oral Q4H PRN Marinus Maw, MD      . DOPamine (INTROPIN) 800 mg in dextrose 5 % 250 mL infusion  3 mcg/kg/min Intravenous Titrated Quintella Reichert, MD 3.6 mL/hr at 03/05/13 0546 3 mcg/kg/min at 03/05/13 0546  . fenofibrate tablet 160 mg  160 mg Oral Daily Wilburt Finlay, PA-C   160 mg at 03/07/13 1610  . flecainide (TAMBOCOR) tablet 100 mg  100 mg Oral Q12H Marinus Maw, MD   100 mg at 03/07/13 2130  . ketorolac (TORADOL) 30 MG/ML injection 15 mg  15 mg Intravenous Q6H PRN Wilburt Finlay, PA-C      . metoprolol tartrate (LOPRESSOR) tablet 75 mg  75 mg Oral BID Brittainy Simmons, PA-C   75 mg at 03/07/13 2130  . nitroGLYCERIN (NITROSTAT) SL tablet 0.4 mg  0.4 mg Sublingual Q5 Min x 3 PRN Wilburt Finlay, PA-C      . ondansetron [facility]) injection 4 mg  4 mg Intravenous Q6H PRN Runell Gess, MD      . Rivaroxaban [name]) tablet 20 mg  20 mg Oral Q supper Wilburt Finlay, PA-C   20 mg at 03/07/13 1704  . simvastatin (ZOCOR) tablet 40 mg  40 mg Oral q1800 Wilburt Finlay, PA-C   40 mg at 03/07/13 1704  . sodium chloride 0.9 % injection 3 mL  3 mL Intravenous Q12H Brittainy Simmons, PA-C   3 mL at 03/07/13 2130  . sodium chloride 0.9 % injection 3 mL  3 mL Intravenous PRN Robbie Lis, PA-C        Physical Exam:  General: Alert, oriented x3, no distress Head: no evidence of trauma, PERRL, EOMI, no exophtalmos or lid lag, no myxedema, no xanthelasma; normal ears, nose and oropharynx Neck: normal jugular venous pulsations and no hepatojugular reflux; brisk carotid pulses without delay and no carotid bruits Chest: clear to auscultation, no signs of consolidation by percussion or palpation, normal fremitus, symmetrical and full respiratory excursions; healthy left subclavian PM site Cardiovascular: normal position and quality of the apical impulse, regular rhythm, normal first and second heart sounds, no murmurs, rubs or gallops Abdomen: no tenderness or distention, no masses by palpation, no abnormal pulsatility or arterial bruits, normal bowel sounds, no hepatosplenomegaly Extremities: no clubbing, cyanosis or edema; 2+ radial, ulnar and brachial pulses bilaterally; 2+ right femoral, posterior tibial and dorsalis pedis pulses; 2+ left femoral, posterior tibial and dorsalis pedis pulses; no subclavian or femoral bruits Neurological: grossly nonfocal   Lab Results: Results for orders placed  during the hospital encounter of 03/04/13 (from the past 48 hour(s))  CBC     Status: Abnormal   Collection Time    03/08/13  6:04 AM      Result Value Range   WBC 10.6 (*) 4.0 - 10.5 K/uL   RBC 4.86  4.22 - 5.81 MIL/uL   Hemoglobin 14.7  13.0 - 17.0 g/dL   HCT 40.9  81.1 - 91.4 %   MCV 87.9  78.0 - 100.0 fL   MCH 30.2  26.0 - 34.0 pg   MCHC 34.4  30.0 - 36.0 g/dL   RDW 78.2  95.6 - 21.3 %   Platelets 264  150 - 400 K/uL  BASIC METABOLIC PANEL     Status: Abnormal   Collection Time    03/08/13  6:04 AM      Result Value Range   Sodium 134 (*) 135 - 145  mEq/L   Potassium 4.1  3.5 - 5.1 mEq/L   Chloride 102  96 - 112 mEq/L   CO2 24  19 - 32 mEq/L   Glucose, Bld 104 (*) 70 - 99 mg/dL   BUN 23  6 - 23 mg/dL   Creatinine, Ser 0.86  0.50 - 1.35 mg/dL   Calcium 8.9  8.4 - 57.8 mg/dL   GFR calc non Af Amer 85 (*) >90 mL/min   GFR calc Af Amer >90  >90 mL/min   Comment:            The eGFR has been calculated     using the CKD EPI equation.     This calculation has not been     validated in all clinical     situations.     eGFR's persistently     <90 mL/min signify     possible Chronic Kidney Disease.    Imaging: Dg Chest 2 View  03/07/2013   *RADIOLOGY REPORT*  Clinical Data: Post pacemaker  CHEST - 2 VIEW  Comparison: None  Findings: Left subclavian transvenous pacemaker leads project at right atrium and right ventricle. Upper normal heart size. Normal mediastinal contours and pulmonary vascularity. Changes of COPD with bibasilar effusions and atelectasis. Upper lungs clear. No pneumothorax. No acute osseous findings.  IMPRESSION: COPD with bibasilar pleural effusions and atelectasis. No pneumothorax following pacemaker insertion.   Original Report Authenticated By: Ulyses Southward, M.D.    Assessment:  1. Principal Problem: 2.   Chest pain on respiration 3. Active Problems: 4.   PAF (paroxysmal atrial fibrillation) 5.   HLD (hyperlipidemia) 6.   Factor V deficiency 7.   HTN (hypertension) 8.   Atrial flutter with rapid ventricular response - s/p RF Ablation 03/06/2012 9.   S/P placement of cardiac pacemaker 10.   Atrial fibrillation with RVR 11.   Plan:  1. DC home 2. Wound care and activity restrictions discussed. 3. Wound check in 7-10 days 4. PPM check in a month 5. Resume rivaroxaban 6. New Rx for flecainide and increased dose metoprolol.  Time Spent Directly with Patient:  35 minutes  Length of Stay:  LOS: 4 days    Denia Mcvicar 03/08/2013, 8:54 AM

## 2013-03-17 ENCOUNTER — Ambulatory Visit (INDEPENDENT_AMBULATORY_CARE_PROVIDER_SITE_OTHER): Payer: Medicare Other | Admitting: Physician Assistant

## 2013-03-17 ENCOUNTER — Encounter: Payer: Self-pay | Admitting: Physician Assistant

## 2013-03-17 VITALS — BP 150/90 | HR 79 | Ht 63.0 in | Wt 136.0 lb

## 2013-03-17 DIAGNOSIS — Z0389 Encounter for observation for other suspected diseases and conditions ruled out: Secondary | ICD-10-CM

## 2013-03-17 DIAGNOSIS — I4891 Unspecified atrial fibrillation: Secondary | ICD-10-CM

## 2013-03-17 DIAGNOSIS — I4892 Unspecified atrial flutter: Secondary | ICD-10-CM

## 2013-03-17 DIAGNOSIS — I10 Essential (primary) hypertension: Secondary | ICD-10-CM

## 2013-03-17 DIAGNOSIS — IMO0001 Reserved for inherently not codable concepts without codable children: Secondary | ICD-10-CM

## 2013-03-17 DIAGNOSIS — I480 Paroxysmal atrial fibrillation: Secondary | ICD-10-CM

## 2013-03-17 DIAGNOSIS — Z950 Presence of cardiac pacemaker: Secondary | ICD-10-CM

## 2013-03-17 DIAGNOSIS — E785 Hyperlipidemia, unspecified: Secondary | ICD-10-CM

## 2013-03-17 NOTE — Assessment & Plan Note (Signed)
Pacer site has no signs of infection. He'll be set up for one month the patient check.

## 2013-03-17 NOTE — Assessment & Plan Note (Addendum)
Atrial paced rhythm. Sinus. 70 BPM.  Continues on Flecainide.

## 2013-03-17 NOTE — Patient Instructions (Signed)
Continue his arm or strictures as discussed at discharge.  She noticed redness, swelling discharge or increased tenderness from the pacer site, call our office immediately.

## 2013-03-17 NOTE — Assessment & Plan Note (Signed)
All the patient's blood pressure is elevated today in the clinic he states it is usually very well controlled. I'll make no medication changes at this time.

## 2013-03-17 NOTE — Progress Notes (Signed)
Date:  03/17/2013   ID:  Patrick Mccall, DOB [birth year], MRN [id number]  PCP:  Ailene Ravel, MD  Primary Cardiologist:  Croitoru    History of Present Illness: Patrick Mccall is a 67 y.o. male    The patient is a 67 yo male with a history of Factor V Leiden deficiency, PAF, HLD, HTN. He has been taking Xarelto since Aug 2013. He exercises five times per week. The patient had developed CP yesterday which was worse with deep inspiration and progressed to a point when it hurt with any breath. When EMS arrived he was in Afib/flutter with a rapid VR. EKG showed J-Point elevation and he was taken emergently to the cath lab. He had normal coronaries and NL LVF. He converted spontaeously to NSR after the cath. That night he had pauses and PAF on telemetry. He was put on Dopamine and transferred to [facility]. He was seen in consult by Dr Ladona Ridgel and after discussion with the pt and his family it was decided to proceed with RFS and PTVDP placement. This was done 03/06/13 without complication. He received a BS device. Dr Royann Shivers feels he can be discharged 03/08/13. He is on Flecanide 100mg  BID.   Patient presented today for a wound site check. The patient currently denies nausea, vomiting, fever, chest pain, shortness of breath, orthopnea, dizziness, PND, cough, congestion, abdominal pain, hematochezia, melena, lower extremity edema.   Wt Readings from Last 3 Encounters:  03/17/13 136 lb (61.689 kg)  03/08/13 136 lb 0.4 oz (61.7 kg)  03/08/13 136 lb 0.4 oz (61.7 kg)     Past Medical History  Diagnosis Date  . PAF (paroxysmal atrial fibrillation)     aflutter ablation 02/2013, pacer implant  . Factor V Leiden deficiency   . HLD (hyperlipidemia)   . Hypertension   . Sleep apnea    Family History  Problem Relation Age of Onset  . Heart attack Mother   . Heart attack Father   . Heart attack Maternal Grandmother   . Heart attack Maternal Grandfather   . Heart attack Paternal Grandmother   .  Heart attack Paternal Grandfather      Current Outpatient Prescriptions  Medication Sig Dispense Refill  . acetaminophen (TYLENOL) 500 MG tablet Take 500 mg by mouth every 6 (six) hours as needed for pain.      . fenofibrate (TRICOR) 145 MG tablet Take 145 mg by mouth daily.      . Fish Oil-Cholecalciferol (FISH OIL + D3) 1200-1000 MG-UNIT CAPS Take 1,000 mg by mouth daily.      . flecainide (TAMBOCOR) 100 MG tablet Take 1 tablet (100 mg total) by mouth every 12 (twelve) hours.  60 tablet  11  . metoprolol (LOPRESSOR) 25 MG tablet Take 3 tablets (75 mg total) by mouth 2 (two) times daily.  100 tablet  11  . Multiple Vitamins-Minerals (MULTIVITAMIN WITH MINERALS) tablet Take 1 tablet by mouth daily.      . Rivaroxaban (XARELTO) 20 MG TABS Take 20 mg by mouth daily with supper.      . simvastatin (ZOCOR) 40 MG tablet Take 40 mg by mouth every evening.       No current facility-administered medications for this visit.    Allergies:   No Known Allergies  Social History:  The patient  reports that he has never smoked. He does not have any smokeless tobacco history on file. He reports that he drinks about 0.5 ounces of alcohol per week. He reports  that he does not use illicit drugs.   ROS:  Please see the history of present illness.  All other systems reviewed and negative.   PHYSICAL EXAM: VS:  BP 150/90  Pulse 79  Ht 5\' 3"  (1.6 m)  Wt 136 lb (61.689 kg)  BMI 24.1 kg/m2 Well nourished, well developed, in no acute distress HEENT: Pupils are equal round react to light accommodation extraocular movements are intact. . Cardiac: Regular rate and rhythm without murmurs rubs or gallops. Lungs:  clear to auscultation bilaterally, no wheezing, rhonchi or rales Ext: no lower extremity edema.  2+ radial pulses. Skin: warm and dry.  Minimal ecchymosis at pacer site. No hematoma, erythema or edema Neuro:  Grossly normal  EKG:  Atrial paced rhythm 70 beats per minute    ASSESSMENT AND  PLAN:  Problem List Items Addressed This Visit   Atrial flutter with rapid ventricular response - s/p RF Ablation 03/06/2012     Atrial paced rhythm. Sinus. 70 BPM.  Continues on Flecainide.      HLD (hyperlipidemia) (Chronic)   HTN (hypertension) (Chronic)     All the patient's blood pressure is elevated today in the clinic he states it is usually very well controlled. I'll make no medication changes at this time.    Normal coronary arteries at urgent cath 03/04/13   S/P placement of cardiac pacemaker- BS Guident 03/06/13     Pacer site has no signs of infection. He'll be set up for one month the patient check.     Other Visit Diagnoses   PAF (paroxysmal atrial fibrillation)    -  Primary    Relevant Orders       EKG 12-Lead

## 2013-04-15 LAB — PACEMAKER DEVICE OBSERVATION

## 2013-04-16 ENCOUNTER — Ambulatory Visit (INDEPENDENT_AMBULATORY_CARE_PROVIDER_SITE_OTHER): Payer: Medicare Other | Admitting: Cardiovascular Disease

## 2013-04-16 ENCOUNTER — Encounter: Payer: Self-pay | Admitting: Cardiovascular Disease

## 2013-04-16 VITALS — BP 132/88 | HR 70 | Resp 16 | Ht 63.0 in | Wt 138.2 lb

## 2013-04-16 DIAGNOSIS — Z950 Presence of cardiac pacemaker: Secondary | ICD-10-CM

## 2013-04-16 DIAGNOSIS — I4892 Unspecified atrial flutter: Secondary | ICD-10-CM

## 2013-04-16 DIAGNOSIS — I480 Paroxysmal atrial fibrillation: Secondary | ICD-10-CM

## 2013-04-16 DIAGNOSIS — I4891 Unspecified atrial fibrillation: Secondary | ICD-10-CM

## 2013-04-16 LAB — PACEMAKER DEVICE OBSERVATION
AL AMPLITUDE: 5.5 mv
AL THRESHOLD: 0.8 V
BAMS-0001: 150 {beats}/min
DEVICE MODEL PM: 112353
RV LEAD AMPLITUDE: 13.5 mv
RV LEAD THRESHOLD: 1.1 V
VENTRICULAR PACING PM: 4

## 2013-04-16 NOTE — Patient Instructions (Addendum)
Your home pacemaker monitor is constantly monitoring your device, and will automatically send transmissions every three months. You will follow up for an office interrogation in 6 months with Dr.Croitoru.

## 2013-04-16 NOTE — Progress Notes (Signed)
In office pacemaker interrogation. Normal device function. Minimal changes made this session. 

## 2013-04-19 DIAGNOSIS — I480 Paroxysmal atrial fibrillation: Secondary | ICD-10-CM | POA: Insufficient documentation

## 2013-04-19 NOTE — Progress Notes (Signed)
Patient ID: Merit Maybee, male   DOB: 10-Mar-1946, 67 y.o.   MRN: [id number]     Reason for office visit Atrial fibrillation, pacemaker followup  Roughly 6 weeks following pacemaker implantation for sinus node dysfunction with symptomatic sinus pauses. Bradyarrhythmialikely partly related to treatment with flecainide and beta blockers for symptomatic paroxysmal atrial fibrillation with rapid ventricular response. Denies syncope, palpitations, bleeding or focal neurological deficits.  Device check today shows normal function. Parameters adjusted to chronic settings. No atrial fibrillation recorded after device implantation      No Known Allergies  Current Outpatient Prescriptions  Medication Sig Dispense Refill  . acetaminophen (TYLENOL) 500 MG tablet Take 500 mg by mouth every 6 (six) hours as needed for pain.      . fenofibrate (TRICOR) 145 MG tablet Take 145 mg by mouth daily.      . Fish Oil-Cholecalciferol (FISH OIL + D3) 1200-1000 MG-UNIT CAPS Take 1,000 mg by mouth daily.      . flecainide (TAMBOCOR) 100 MG tablet Take 1 tablet (100 mg total) by mouth every 12 (twelve) hours.  60 tablet  11  . metoprolol (LOPRESSOR) 25 MG tablet Take 3 tablets (75 mg total) by mouth 2 (two) times daily.  100 tablet  11  . Multiple Vitamins-Minerals (MULTIVITAMIN WITH MINERALS) tablet Take 1 tablet by mouth daily.      . Rivaroxaban (XARELTO) 20 MG TABS Take 20 mg by mouth daily with supper.      . simvastatin (ZOCOR) 40 MG tablet Take 40 mg by mouth every evening.       No current facility-administered medications for this visit.    Past Medical History  Diagnosis Date  . PAF (paroxysmal atrial fibrillation)     aflutter ablation 02/2013, pacer implant  . Factor V Leiden deficiency   . HLD (hyperlipidemia)   . Hypertension   . Sleep apnea     Past Surgical History  Procedure Laterality Date  . Hernia repair    . Cardiac catheterization  02/2013    nl coronaries  . Pacemaker insertion       BS, pauses and sinus brady    Family History  Problem Relation Age of Onset  . Heart attack Mother   . Heart attack Father   . Heart attack Maternal Grandmother   . Heart attack Maternal Grandfather   . Heart attack Paternal Grandmother   . Heart attack Paternal Grandfather     History   Social History  . Marital Status: Married    Spouse Name: N/A    Number of Children: N/A  . Years of Education: N/A   Occupational History  . Not on file.   Social History Main Topics  . Smoking status: Never Smoker   . Smokeless tobacco: Not on file  . Alcohol Use: .5 - 1 oz/week    1-2 drink(s) per week  . Drug Use: No  . Sexually Active: Not on file   Other Topics Concern  . Not on file   Social History Narrative   Exercises five times per week doing cardio.    Review of systems: The patient specifically denies any chest pain at rest or with exertion, dyspnea at rest or with exertion, orthopnea, paroxysmal nocturnal dyspnea, syncope, palpitations, focal neurological deficits, intermittent claudication, lower extremity edema, unexplained weight gain, cough, hemoptysis or wheezing.  The patient also denies abdominal pain, nausea, vomiting, dysphagia, diarrhea, constipation, polyuria, polydipsia, dysuria, hematuria, frequency, urgency, abnormal bleeding or bruising, fever, chills, unexpected weight  changes, mood swings, change in skin or hair texture, change in voice quality, auditory or visual problems, allergic reactions or rashes, new musculoskeletal complaints other than usual "aches and pains".   PHYSICAL EXAM BP 132/88  Pulse 70  Resp 16  Ht 5\' 3"  (1.6 m)  Wt 138 lb 3.2 oz (62.687 kg)  BMI 24.49 kg/m2  General: Alert, oriented x3, no distress Head: no evidence of trauma, PERRL, EOMI, no exophtalmos or lid lag, no myxedema, no xanthelasma; normal ears, nose and oropharynx Neck: normal jugular venous pulsations and no hepatojugular reflux; brisk carotid pulses without  delay and no carotid bruits Chest: clear to auscultation, no signs of consolidation by percussion or palpation, normal fremitus, symmetrical and full respiratory excursions, healthy subclavian pacemaker site Cardiovascular: normal position and quality of the apical impulse, regular rhythm, normal first and second heart sounds, no murmurs, rubs or gallops Abdomen: no tenderness or distention, no masses by palpation, no abnormal pulsatility or arterial bruits, normal bowel sounds, no hepatosplenomegaly Extremities: no clubbing, cyanosis or edema; 2+ radial, ulnar and brachial pulses bilaterally; 2+ right femoral, posterior tibial and dorsalis pedis pulses; 2+ left femoral, posterior tibial and dorsalis pedis pulses; no subclavian or femoral bruits Neurological: grossly nonfocal  Lipid Panel     Component Value Date/Time   CHOL 112 03/04/2013 0800   TRIG 60 03/04/2013 0800   HDL 35* 03/04/2013 0800   CHOLHDL 3.2 03/04/2013 0800   VLDL 12 03/04/2013 0800   LDLCALC 65 03/04/2013 0800    BMET    Component Value Date/Time   NA 134* 03/08/2013 0604   K 4.1 03/08/2013 0604   CL 102 03/08/2013 0604   CO2 24 03/08/2013 0604   GLUCOSE 104* 03/08/2013 0604   BUN 23 03/08/2013 0604   CREATININE 0.94 03/08/2013 0604   CALCIUM 8.9 03/08/2013 0604   GFRNONAA 85* 03/08/2013 0604   GFRAA >90 03/08/2013 0604     ASSESSMENT AND PLAN Pacemaker [employer] Advantio implanted 03/06/2013 Normal device function. battery at end-of-life with estimated longevity of about 14 years. Atrial lead P waves 5.5 mV, impedance 690 ohms, threshold 0.8V@ 0.61ms, ventricular lead R waves 13.5 mV, impedance 697 ohms, threshold 1.1V@0 .4 ms. Underlying escape rhythm 48 bpm.   Atrial flutter with rapid ventricular response - s/p RF Ablation 03/06/2012 No arrhythmia recurrence since pacemaker implantation. He had successful ablation in 2013 but appears had recurrent paroxysmal atrial fibrillation just before pacemaker implantation.  Continue flecainide  Paroxysmal atrial fibrillation On Xarelto and flecainide.. No history of CVA/TIA or serious bleeding.  Enroll in remote [employer] pacemaker followup  Orders Placed This Encounter  Procedures  . Pacemaker Device Observation   No orders of the defined types were placed in this encounter.    Junious Silk, MD, [facility] [phone] office [phone] pager

## 2013-04-19 NOTE — Assessment & Plan Note (Signed)
Normal device function. battery at end-of-life with estimated longevity of about 14 years. Atrial lead P waves 5.5 mV, impedance 690 ohms, threshold 0.8V@ 0.37ms, ventricular lead R waves 13.5 mV, impedance 697 ohms, threshold 1.1V@0 .4 ms. Underlying escape rhythm 48 bpm.

## 2013-04-19 NOTE — Assessment & Plan Note (Signed)
No arrhythmia recurrence since pacemaker implantation. He had successful ablation in 2013 but appears had recurrent paroxysmal atrial fibrillation just before pacemaker implantation. Continue flecainide

## 2013-04-19 NOTE — Assessment & Plan Note (Signed)
On Xarelto and flecainide.. No history of CVA/TIA or serious bleeding.

## 2013-05-02 ENCOUNTER — Encounter: Payer: Self-pay | Admitting: Cardiovascular Disease

## 2013-06-12 ENCOUNTER — Ambulatory Visit (INDEPENDENT_AMBULATORY_CARE_PROVIDER_SITE_OTHER): Payer: Medicare Other | Admitting: Internal Medicine

## 2013-06-12 ENCOUNTER — Encounter: Payer: Self-pay | Admitting: Internal Medicine

## 2013-06-12 VITALS — BP 136/74 | HR 70 | Ht 63.0 in

## 2013-06-12 DIAGNOSIS — I4891 Unspecified atrial fibrillation: Secondary | ICD-10-CM

## 2013-06-12 DIAGNOSIS — Z950 Presence of cardiac pacemaker: Secondary | ICD-10-CM

## 2013-06-12 DIAGNOSIS — I480 Paroxysmal atrial fibrillation: Secondary | ICD-10-CM

## 2013-06-12 DIAGNOSIS — I4892 Unspecified atrial flutter: Secondary | ICD-10-CM

## 2013-06-12 DIAGNOSIS — I10 Essential (primary) hypertension: Secondary | ICD-10-CM

## 2013-06-12 LAB — PACEMAKER DEVICE OBSERVATION
AL AMPLITUDE: 5.6 mv
BAMS-0001: 150 {beats}/min
DEVICE MODEL PM: 112353

## 2013-06-12 NOTE — Assessment & Plan Note (Signed)
He is maintaining sinus rhythm on his current dose of flecainide, and metoprolol. No change in medical therapy.

## 2013-06-12 NOTE — Assessment & Plan Note (Signed)
His [employer] ICD is working normally. We'll plan to recheck in several months. 

## 2013-06-12 NOTE — Patient Instructions (Addendum)
Your physician wants you to follow-up in: 8 months with Dr. Ladona Ridgel. You will receive a reminder letter in the mail two months in advance. If you don't receive a letter, please call our office to schedule the follow-up appointment.  Your physician recommends that you continue on your current medications as directed. Please refer to the Current Medication list given to you today.

## 2013-06-12 NOTE — Assessment & Plan Note (Signed)
His blood pressure is well controlled. He'll continue his current medical therapy and maintain a low-sodium diet.

## 2013-06-12 NOTE — Progress Notes (Signed)
HPI [name] returns today for followup. He is a very pleasant 67 year old man with symptomatic tachycardia bradycardia syndrome, paroxysmal atrial fibrillation, status post permanent pacemaker insertion. In the interim, the patient has been stable, benign chest pain or shortness of breath. No syncope. No peripheral edema. No Known Allergies   Current Outpatient Prescriptions  Medication Sig Dispense Refill  . acetaminophen (TYLENOL) 500 MG tablet Take 500 mg by mouth every 6 (six) hours as needed for pain.      . fenofibrate (TRICOR) 145 MG tablet Take 145 mg by mouth daily.      . Fish Oil-Cholecalciferol (FISH OIL + D3) 1200-1000 MG-UNIT CAPS Take 1,000 mg by mouth daily.      . flecainide (TAMBOCOR) 100 MG tablet Take 1 tablet (100 mg total) by mouth every 12 (twelve) hours.  60 tablet  11  . metoprolol (LOPRESSOR) 25 MG tablet Take 3 tablets (75 mg total) by mouth 2 (two) times daily.  100 tablet  11  . Multiple Vitamins-Minerals (MULTIVITAMIN WITH MINERALS) tablet Take 1 tablet by mouth daily.      . Rivaroxaban (XARELTO) 20 MG TABS Take 20 mg by mouth daily with supper.      . simvastatin (ZOCOR) 40 MG tablet Take 40 mg by mouth every evening.       No current facility-administered medications for this visit.     Past Medical History  Diagnosis Date  . PAF (paroxysmal atrial fibrillation)     aflutter ablation 02/2013, pacer implant  . Factor V Leiden deficiency   . HLD (hyperlipidemia)   . Hypertension   . Sleep apnea     ROS:   All systems reviewed and negative except as noted in the HPI.   Past Surgical History  Procedure Laterality Date  . Hernia repair    . Cardiac catheterization  02/2013    nl coronaries  . Pacemaker insertion      BS, pauses and sinus brady     Family History  Problem Relation Age of Onset  . Heart attack Mother   . Heart attack Father   . Heart attack Maternal Grandmother   . Heart attack Maternal Grandfather   . Heart attack  Paternal Grandmother   . Heart attack Paternal Grandfather      History   Social History  . Marital Status: Married    Spouse Name: N/A    Number of Children: N/A  . Years of Education: N/A   Occupational History  . Not on file.   Social History Main Topics  . Smoking status: Never Smoker   . Smokeless tobacco: Not on file  . Alcohol Use: .5 - 1 oz/week    1-2 drink(s) per week  . Drug Use: No  . Sexual Activity: Not on file   Other Topics Concern  . Not on file   Social History Narrative   Exercises five times per week doing cardio.     BP 136/74  Pulse 70  Ht 5\' 3"  (1.6 m)  Physical Exam:  Well appearing 67 year old man,NAD HEENT: Unremarkable Neck:  No JVD, no thyromegally Back:  No CVA tenderness Lungs:  Clear with no wheezes, rales, or rhonchi. Well-healed pacemaker incision. HEART:  Regular rate rhythm, no murmurs, no rubs, no clicks Abd:  soft, positive bowel sounds, no organomegally, no rebound, no guarding Ext:  2 plus pulses, no edema, no cyanosis, no clubbing Skin:  No rashes no nodules Neuro:  CN II through XII intact, motor grossly  intact   DEVICE  Normal device function.  See PaceArt for details.   Assess/Plan:

## 2013-06-16 ENCOUNTER — Encounter: Payer: Self-pay | Admitting: Cardiovascular Disease

## 2013-07-11 ENCOUNTER — Other Ambulatory Visit: Payer: Self-pay | Admitting: *Deleted

## 2013-07-21 ENCOUNTER — Telehealth: Payer: Self-pay | Admitting: *Deleted

## 2013-07-21 NOTE — Telephone Encounter (Signed)
Pt called stating that his monitor that is set up next to his bed and it is flashing yellow and he wants to know what to do.

## 2013-07-21 NOTE — Telephone Encounter (Signed)
LM for patient to push the button on the front of th monitor. I encouraged him to call back with further questions.

## 2013-07-21 NOTE — Telephone Encounter (Signed)
Message forwarded to S. Saunders, CMA r/t device/monitor concerns. 

## 2013-08-18 ENCOUNTER — Other Ambulatory Visit: Payer: Self-pay | Admitting: *Deleted

## 2013-08-18 MED ORDER — METOPROLOL TARTRATE 25 MG PO TABS: 75.0000 mg | ORAL_TABLET | Freq: Two times a day (BID) | ORAL | Status: DC

## 2013-09-15 ENCOUNTER — Ambulatory Visit (INDEPENDENT_AMBULATORY_CARE_PROVIDER_SITE_OTHER): Payer: Medicare Other | Admitting: *Deleted

## 2013-09-15 DIAGNOSIS — I4891 Unspecified atrial fibrillation: Secondary | ICD-10-CM

## 2013-09-15 DIAGNOSIS — I4892 Unspecified atrial flutter: Secondary | ICD-10-CM

## 2013-09-15 DIAGNOSIS — I480 Paroxysmal atrial fibrillation: Secondary | ICD-10-CM

## 2013-10-08 ENCOUNTER — Encounter: Payer: Self-pay | Admitting: *Deleted

## 2013-10-08 LAB — PACEMAKER DEVICE OBSERVATION

## 2013-12-15 ENCOUNTER — Encounter: Payer: Medicare Other | Admitting: *Deleted

## 2014-01-05 ENCOUNTER — Encounter: Payer: Self-pay | Admitting: *Deleted

## 2014-01-12 ENCOUNTER — Telehealth: Payer: Self-pay | Admitting: Internal Medicine

## 2014-01-12 ENCOUNTER — Other Ambulatory Visit: Payer: Self-pay | Admitting: Cardiovascular Disease

## 2014-01-12 ENCOUNTER — Encounter: Payer: Self-pay | Admitting: Internal Medicine

## 2014-01-12 ENCOUNTER — Ambulatory Visit (INDEPENDENT_AMBULATORY_CARE_PROVIDER_SITE_OTHER): Payer: Medicare Other | Admitting: *Deleted

## 2014-01-12 DIAGNOSIS — I4892 Unspecified atrial flutter: Secondary | ICD-10-CM

## 2014-01-12 DIAGNOSIS — I480 Paroxysmal atrial fibrillation: Secondary | ICD-10-CM

## 2014-01-12 DIAGNOSIS — I495 Sick sinus syndrome: Secondary | ICD-10-CM

## 2014-01-12 NOTE — Telephone Encounter (Signed)
New message      Pt got letter saying we did not get his remote transmission.  He has a wireless box and do not know why he got a letter.

## 2014-01-12 NOTE — Telephone Encounter (Signed)
Left message for patient to reset transmitter.

## 2014-01-18 LAB — MDC_IDC_ENUM_SESS_TYPE_REMOTE
Battery Remaining Longevity: 144 mo
Implantable Pulse Generator Serial Number: 112353
Lead Channel Impedance Value: 698 Ohm
Lead Channel Sensing Intrinsic Amplitude: 15.8 mV
Lead Channel Setting Pacing Amplitude: 2.5 V
MDC IDC MSMT LEADCHNL RV IMPEDANCE VALUE: 646 Ohm
MDC IDC SET LEADCHNL RA PACING AMPLITUDE: 2 V
MDC IDC SET LEADCHNL RV PACING PULSEWIDTH: 0.4 ms
MDC IDC SET LEADCHNL RV SENSING SENSITIVITY: 2.5 mV
MDC IDC STAT BRADY RA PERCENT PACED: 86 %
MDC IDC STAT BRADY RV PERCENT PACED: 4 %

## 2014-01-27 ENCOUNTER — Encounter: Payer: Self-pay | Admitting: Cardiology

## 2014-01-27 NOTE — Progress Notes (Signed)
Pacemaker remote check. Device function reviewed. Impedance, sensing, auto capture thresholds consistent with previous measurements. Histograms blunted for patient.   All other diagnostic data reviewed and is appropriate and stable for patient. Real time/magnet EGM shows appropriate sensing and capture. No mode switch or ventricular high rate episodes. Estimated longevity 12 years. Plan to follow in 3 months remotely, to see in office annually.  ROV 03/12/14@9 :45am with Dr. Ladona Ridgelaylor.

## 2014-03-12 ENCOUNTER — Encounter: Payer: Self-pay | Admitting: Internal Medicine

## 2014-03-12 ENCOUNTER — Ambulatory Visit (INDEPENDENT_AMBULATORY_CARE_PROVIDER_SITE_OTHER): Payer: Medicare Other | Admitting: Internal Medicine

## 2014-03-12 VITALS — BP 160/84 | HR 108 | Ht 63.0 in | Wt 140.0 lb

## 2014-03-12 DIAGNOSIS — Z950 Presence of cardiac pacemaker: Secondary | ICD-10-CM

## 2014-03-12 DIAGNOSIS — I480 Paroxysmal atrial fibrillation: Secondary | ICD-10-CM

## 2014-03-12 DIAGNOSIS — I4891 Unspecified atrial fibrillation: Secondary | ICD-10-CM

## 2014-03-12 LAB — MDC_IDC_ENUM_SESS_TYPE_INCLINIC
Lead Channel Impedance Value: 630 Ohm
Lead Channel Pacing Threshold Amplitude: 1.1 V
Lead Channel Pacing Threshold Pulse Width: 0.6 ms
Lead Channel Sensing Intrinsic Amplitude: 15.7 mV
Lead Channel Sensing Intrinsic Amplitude: 3.4 mV
Lead Channel Setting Sensing Sensitivity: 2.5 mV
MDC IDC MSMT LEADCHNL RA IMPEDANCE VALUE: 680 Ohm
MDC IDC MSMT LEADCHNL RV PACING THRESHOLD AMPLITUDE: 1.2 V
MDC IDC MSMT LEADCHNL RV PACING THRESHOLD PULSEWIDTH: 0.4 ms
MDC IDC PG SERIAL: 112353
MDC IDC SESS DTM: 20150618040000
MDC IDC SET LEADCHNL RA PACING AMPLITUDE: 2.2 V
MDC IDC SET LEADCHNL RV PACING AMPLITUDE: 2.4 V
MDC IDC SET LEADCHNL RV PACING PULSEWIDTH: 0.4 ms
MDC IDC SET ZONE DETECTION INTERVAL: 375 ms

## 2014-03-12 NOTE — Assessment & Plan Note (Signed)
His [employer] PPM is working normally. Will recheck in several months. 

## 2014-03-12 NOTE — Patient Instructions (Signed)
Remote monitoring is used to monitor your pacemaker from home. This monitoring reduces the number of office visits required to check your device to one time per year. It allows us to keep an eye on the functioning of your device to ensure it is working properly. You are scheduled for a device check from home on 06-15-2014. You may send your transmission at any time that day. If you have a wireless device, the transmission will be sent automatically. After your physician reviews your transmission, you will receive a postcard with your next transmission date.  Your physician recommends that you schedule a follow-up appointment in: 12 months with Dr.Taylor    

## 2014-03-12 NOTE — Assessment & Plan Note (Signed)
He is maintaining NSR. I considered stopping his anti-coagulation. However will continue for now. No bleeding

## 2014-03-12 NOTE — Progress Notes (Signed)
HPI [name] returns today for followup. He is a very pleasant 68 year old man with symptomatic tachycardia bradycardia syndrome, paroxysmal atrial fibrillation, status post permanent pacemaker insertion. In the interim, the patient has been stable, benign chest pain or shortness of breath. No syncope. No peripheral edema. He has not experienced any palpitations. No Known Allergies   Current Outpatient Prescriptions  Medication Sig Dispense Refill  . acetaminophen (TYLENOL) 500 MG tablet Take 500 mg by mouth every 6 (six) hours as needed for pain.      . fenofibrate micronized (LOFIBRA) 134 MG capsule Take 1 capsule by mouth daily.      . flecainide (TAMBOCOR) 100 MG tablet Take 1 tablet (100 mg total) by mouth every 12 (twelve) hours.  60 tablet  11  . metoprolol tartrate (LOPRESSOR) 25 MG tablet Take 3 tablets (75 mg total) by mouth 2 (two) times daily.  180 tablet  9  . Multiple Vitamins-Minerals (MULTIVITAMIN WITH MINERALS) tablet Take 1 tablet by mouth daily.      . Omega-3 Fatty Acids (FISH OIL) 1000 MG CAPS Take 1 capsule by mouth daily.      . Rivaroxaban (XARELTO) 20 MG TABS Take 20 mg by mouth daily with supper.      . simvastatin (ZOCOR) 40 MG tablet Take 40 mg by mouth every evening.       No current facility-administered medications for this visit.     Past Medical History  Diagnosis Date  . PAF (paroxysmal atrial fibrillation)     aflutter ablation 02/2013, pacer implant  . Factor V Leiden deficiency   . HLD (hyperlipidemia)   . Hypertension   . Sleep apnea     ROS:   All systems reviewed and negative except as noted in the HPI.   Past Surgical History  Procedure Laterality Date  . Hernia repair    . Cardiac catheterization  02/2013    nl coronaries  . Pacemaker insertion      BS, pauses and sinus brady     Family History  Problem Relation Age of Onset  . Heart attack Mother   . Heart attack Father   . Heart attack Maternal Grandmother   . Heart attack  Maternal Grandfather   . Heart attack Paternal Grandmother   . Heart attack Paternal Grandfather      History   Social History  . Marital Status: Married    Spouse Name: N/A    Number of Children: N/A  . Years of Education: N/A   Occupational History  . Not on file.   Social History Main Topics  . Smoking status: Never Smoker   . Smokeless tobacco: Not on file  . Alcohol Use: 0.5 - 1.0 oz/week    1-2 drink(s) per week  . Drug Use: No  . Sexual Activity: Not on file   Other Topics Concern  . Not on file   Social History Narrative   Exercises five times per week doing cardio.     BP 160/84  Pulse 108  Ht 5\' 3"  (1.6 m)  Wt 140 lb (63.504 kg)  BMI 24.81 kg/m2  Physical Exam:  Well appearing 68 year old man,NAD HEENT: Unremarkable Neck:  No JVD, no thyromegally Back:  No CVA tenderness Lungs:  Clear with no wheezes, rales, or rhonchi. Well-healed pacemaker incision. HEART:  Regular rate rhythm, no murmurs, no rubs, no clicks Abd:  soft, positive bowel sounds, no organomegally, no rebound, no guarding Ext:  2 plus pulses, no edema, no cyanosis,  no clubbing Skin:  No rashes no nodules Neuro:  CN II through XII intact, motor grossly intact   DEVICE  Normal device function.  See PaceArt for details.   Assess/Plan:

## 2014-03-19 ENCOUNTER — Encounter: Payer: Self-pay | Admitting: Internal Medicine

## 2014-04-02 ENCOUNTER — Other Ambulatory Visit: Payer: Self-pay | Admitting: *Deleted

## 2014-04-02 MED ORDER — FLECAINIDE ACETATE 100 MG PO TABS: 100.0000 mg | ORAL_TABLET | Freq: Two times a day (BID) | ORAL | Status: DC

## 2014-04-21 IMAGING — CR DG CHEST 2V
2 series · 2 of 2 positions shown · non-contrast
Comparison: None

CLINICAL DATA: Post pacemaker

CHEST - 2 VIEW

[w chest pa]
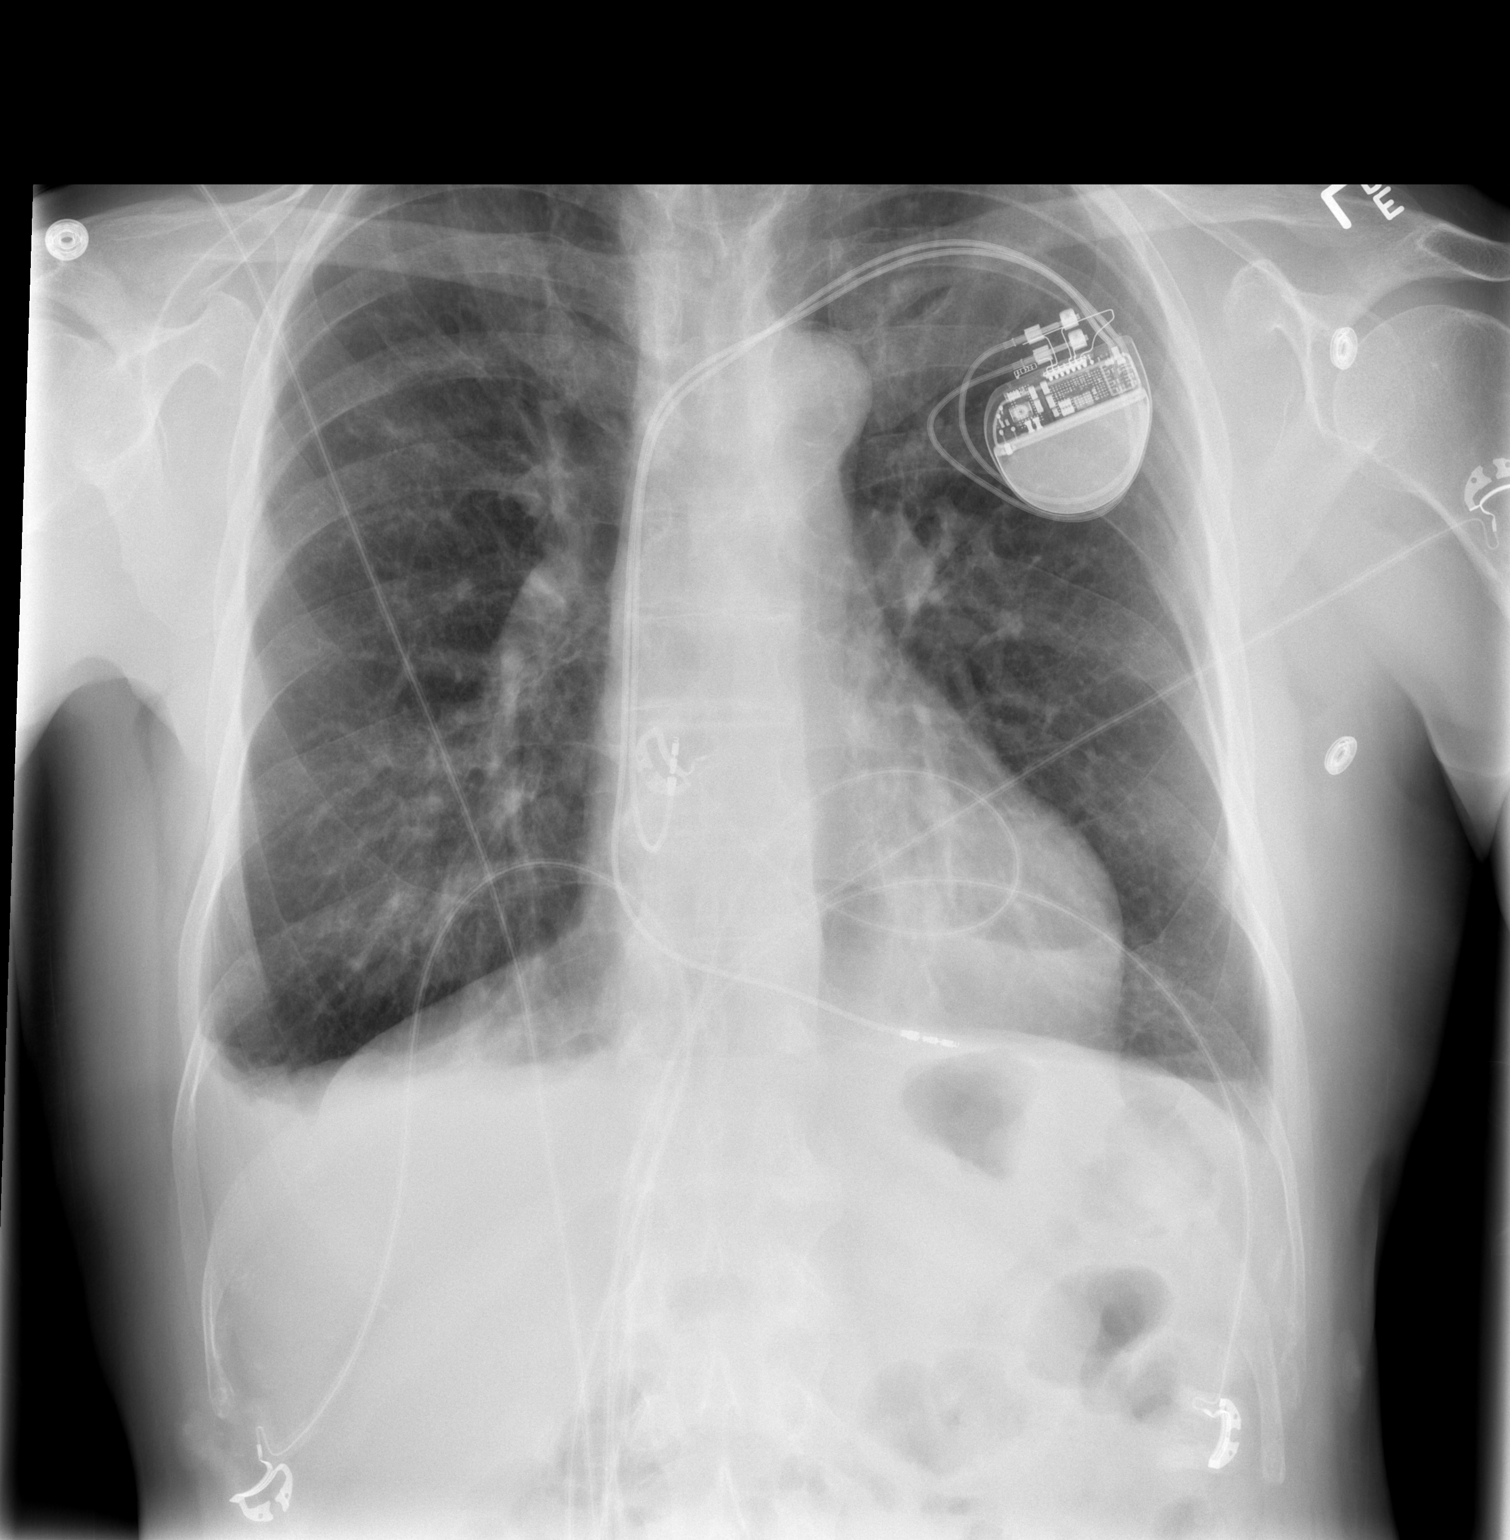

[w chest lat]
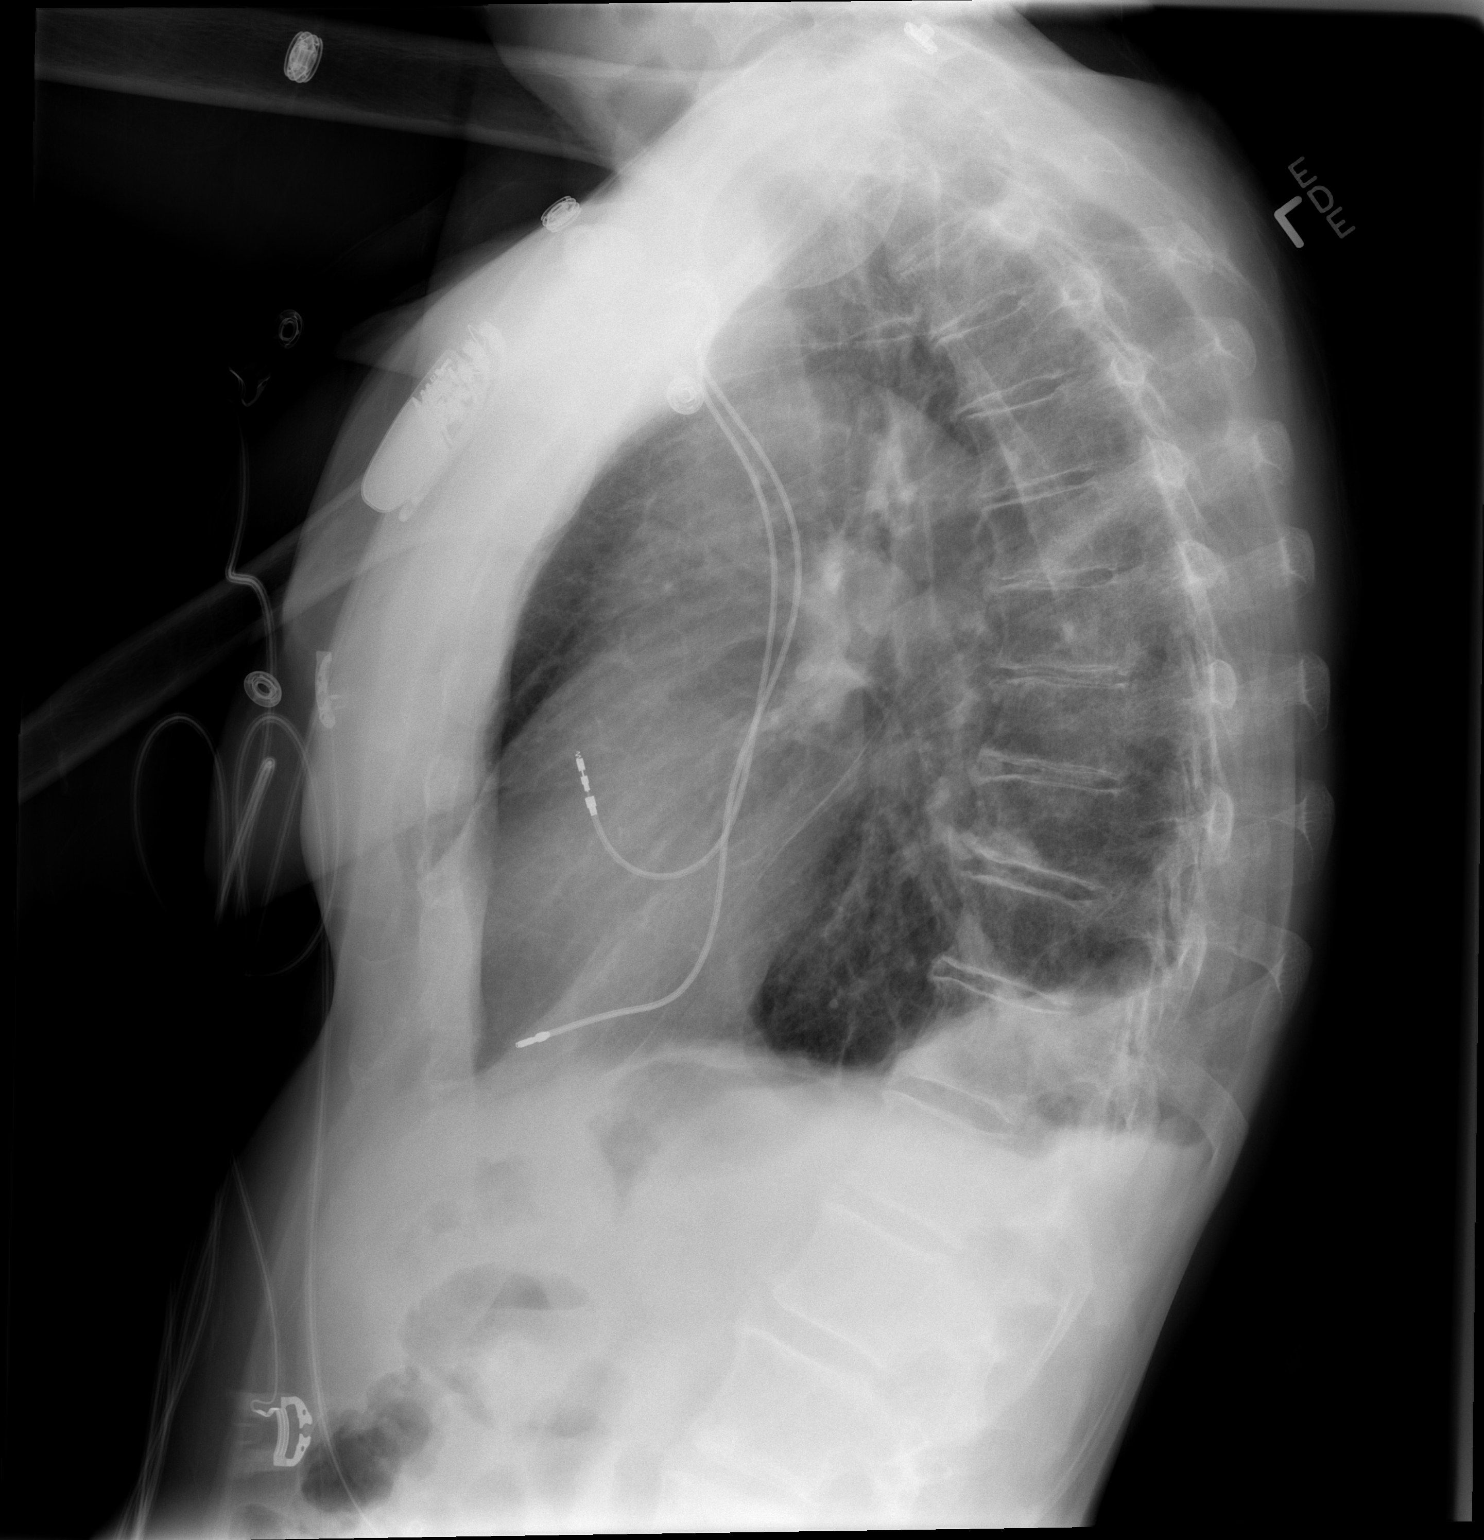

[2 of 2 positions shown; findings below may reference images not displayed]

FINDINGS: Left subclavian transvenous pacemaker leads project at right atrium
and right ventricle.
Upper normal heart size.
Normal mediastinal contours and pulmonary vascularity.
Changes of COPD with bibasilar effusions and atelectasis.
Upper lungs clear.
No pneumothorax.
No acute osseous findings.
IMPRESSION: COPD with bibasilar pleural effusions and atelectasis.
No pneumothorax following pacemaker insertion.

## 2014-05-20 ENCOUNTER — Encounter: Payer: Self-pay | Admitting: Internal Medicine

## 2014-06-15 ENCOUNTER — Encounter: Payer: Self-pay | Admitting: Cardiovascular Disease

## 2014-06-15 ENCOUNTER — Ambulatory Visit (INDEPENDENT_AMBULATORY_CARE_PROVIDER_SITE_OTHER): Payer: Medicare Other | Admitting: *Deleted

## 2014-06-15 DIAGNOSIS — I4891 Unspecified atrial fibrillation: Secondary | ICD-10-CM

## 2014-06-15 DIAGNOSIS — I480 Paroxysmal atrial fibrillation: Secondary | ICD-10-CM

## 2014-06-15 LAB — MDC_IDC_ENUM_SESS_TYPE_REMOTE
Lead Channel Impedance Value: 637 Ohm
Lead Channel Impedance Value: 679 Ohm
Lead Channel Sensing Intrinsic Amplitude: 13.9 mV
Lead Channel Setting Pacing Amplitude: 2.4 V
MDC IDC PG SERIAL: 112353
MDC IDC SET LEADCHNL RA PACING AMPLITUDE: 2.2 V
MDC IDC SET LEADCHNL RV PACING PULSEWIDTH: 0.4 ms
MDC IDC SET LEADCHNL RV SENSING SENSITIVITY: 2.5 mV
MDC IDC STAT BRADY RA PERCENT PACED: 88 %
MDC IDC STAT BRADY RV PERCENT PACED: 6 %
Zone Setting Detection Interval: 375 ms

## 2014-06-15 NOTE — Progress Notes (Signed)
Remote pacemaker transmission.   

## 2014-06-22 ENCOUNTER — Encounter: Payer: Self-pay | Admitting: Cardiology

## 2014-06-25 ENCOUNTER — Other Ambulatory Visit: Payer: Self-pay | Admitting: *Deleted

## 2014-06-25 MED ORDER — METOPROLOL TARTRATE 25 MG PO TABS: 75.0000 mg | ORAL_TABLET | Freq: Two times a day (BID) | ORAL | Status: DC

## 2014-08-24 ENCOUNTER — Other Ambulatory Visit: Payer: Self-pay

## 2014-08-24 MED ORDER — METOPROLOL TARTRATE 25 MG PO TABS: 75.0000 mg | ORAL_TABLET | Freq: Two times a day (BID) | ORAL | Status: DC

## 2014-08-24 NOTE — Telephone Encounter (Signed)
Rx sent to pharmacy   

## 2014-09-03 ENCOUNTER — Encounter (HOSPITAL_COMMUNITY): Payer: Self-pay | Admitting: Cardiovascular Disease

## 2014-09-15 ENCOUNTER — Encounter: Payer: Self-pay | Admitting: Internal Medicine

## 2014-09-15 ENCOUNTER — Ambulatory Visit (INDEPENDENT_AMBULATORY_CARE_PROVIDER_SITE_OTHER): Payer: Medicare Other | Admitting: *Deleted

## 2014-09-15 DIAGNOSIS — Z950 Presence of cardiac pacemaker: Secondary | ICD-10-CM

## 2014-09-15 DIAGNOSIS — I480 Paroxysmal atrial fibrillation: Secondary | ICD-10-CM

## 2014-09-15 LAB — MDC_IDC_ENUM_SESS_TYPE_REMOTE
Battery Remaining Longevity: 132 mo
Brady Statistic RV Percent Paced: 7 %
Implantable Pulse Generator Serial Number: 112353
Lead Channel Impedance Value: 696 Ohm
Lead Channel Pacing Threshold Amplitude: 1.1 V
Lead Channel Pacing Threshold Pulse Width: 0.6 ms
Lead Channel Setting Pacing Amplitude: 2.2 V
Lead Channel Setting Sensing Sensitivity: 2.5 mV
MDC IDC MSMT BATTERY REMAINING PERCENTAGE: 100 %
MDC IDC MSMT LEADCHNL RV IMPEDANCE VALUE: 656 Ohm
MDC IDC SESS DTM: 20151222100100
MDC IDC SET LEADCHNL RV PACING AMPLITUDE: 2.4 V
MDC IDC SET LEADCHNL RV PACING PULSEWIDTH: 0.4 ms
MDC IDC SET ZONE DETECTION INTERVAL: 375 ms
MDC IDC STAT BRADY RA PERCENT PACED: 89 %

## 2014-09-15 NOTE — Progress Notes (Signed)
Remote pacemaker transmission.   

## 2014-09-21 ENCOUNTER — Other Ambulatory Visit: Payer: Self-pay | Admitting: Internal Medicine

## 2014-09-22 ENCOUNTER — Encounter: Payer: Self-pay | Admitting: Cardiology

## 2014-10-22 ENCOUNTER — Other Ambulatory Visit: Payer: Self-pay | Admitting: Internal Medicine

## 2014-12-15 ENCOUNTER — Ambulatory Visit (INDEPENDENT_AMBULATORY_CARE_PROVIDER_SITE_OTHER): Payer: Medicare Other | Admitting: *Deleted

## 2014-12-15 DIAGNOSIS — I480 Paroxysmal atrial fibrillation: Secondary | ICD-10-CM

## 2014-12-15 LAB — MDC_IDC_ENUM_SESS_TYPE_REMOTE
Battery Remaining Longevity: 126 mo
Battery Remaining Percentage: 100 %
Brady Statistic RV Percent Paced: 7 %
Date Time Interrogation Session: 20160322090200
Implantable Pulse Generator Serial Number: 112353
Lead Channel Impedance Value: 636 Ohm
Lead Channel Impedance Value: 681 Ohm
Lead Channel Pacing Threshold Amplitude: 1.1 V
Lead Channel Pacing Threshold Pulse Width: 0.6 ms
Lead Channel Setting Pacing Pulse Width: 0.4 ms
MDC IDC SET LEADCHNL RA PACING AMPLITUDE: 2.2 V
MDC IDC SET LEADCHNL RV PACING AMPLITUDE: 2.4 V
MDC IDC SET LEADCHNL RV SENSING SENSITIVITY: 2.5 mV
MDC IDC STAT BRADY RA PERCENT PACED: 90 %
Zone Setting Detection Interval: 375 ms

## 2014-12-15 NOTE — Progress Notes (Signed)
Remote pacemaker transmission.   

## 2014-12-30 ENCOUNTER — Encounter: Payer: Self-pay | Admitting: Cardiology

## 2015-01-05 ENCOUNTER — Encounter: Payer: Self-pay | Admitting: Internal Medicine

## 2015-02-19 ENCOUNTER — Other Ambulatory Visit: Payer: Self-pay | Admitting: Internal Medicine

## 2015-03-01 ENCOUNTER — Other Ambulatory Visit: Payer: Self-pay | Admitting: Internal Medicine

## 2015-03-02 ENCOUNTER — Other Ambulatory Visit: Payer: Self-pay | Admitting: Internal Medicine

## 2015-03-12 ENCOUNTER — Ambulatory Visit (INDEPENDENT_AMBULATORY_CARE_PROVIDER_SITE_OTHER): Payer: Medicare Other | Admitting: Internal Medicine

## 2015-03-12 ENCOUNTER — Encounter: Payer: Self-pay | Admitting: Internal Medicine

## 2015-03-12 ENCOUNTER — Other Ambulatory Visit: Payer: Self-pay

## 2015-03-12 VITALS — BP 142/84 | HR 69 | Ht 63.0 in | Wt 134.0 lb

## 2015-03-12 DIAGNOSIS — Z950 Presence of cardiac pacemaker: Secondary | ICD-10-CM | POA: Diagnosis not present

## 2015-03-12 DIAGNOSIS — I480 Paroxysmal atrial fibrillation: Secondary | ICD-10-CM

## 2015-03-12 DIAGNOSIS — I10 Essential (primary) hypertension: Secondary | ICD-10-CM | POA: Diagnosis not present

## 2015-03-12 DIAGNOSIS — I4892 Unspecified atrial flutter: Secondary | ICD-10-CM | POA: Diagnosis not present

## 2015-03-12 LAB — CUP PACEART INCLINIC DEVICE CHECK
Lead Channel Impedance Value: 681 Ohm
Lead Channel Pacing Threshold Amplitude: 1.1 V
Lead Channel Pacing Threshold Pulse Width: 0.4 ms
Lead Channel Setting Pacing Amplitude: 2.4 V
Lead Channel Setting Pacing Pulse Width: 0.4 ms
MDC IDC MSMT LEADCHNL RA PACING THRESHOLD PULSEWIDTH: 0.6 ms
MDC IDC MSMT LEADCHNL RA SENSING INTR AMPL: 3.9 mV
MDC IDC MSMT LEADCHNL RV IMPEDANCE VALUE: 614 Ohm
MDC IDC MSMT LEADCHNL RV PACING THRESHOLD AMPLITUDE: 1.3 V
MDC IDC MSMT LEADCHNL RV SENSING INTR AMPL: 14.5 mV
MDC IDC PG SERIAL: 112353
MDC IDC SESS DTM: 20160617040000
MDC IDC SET LEADCHNL RA PACING AMPLITUDE: 2.2 V
MDC IDC SET LEADCHNL RV SENSING SENSITIVITY: 2.5 mV
Zone Setting Detection Interval: 375 ms

## 2015-03-12 NOTE — Assessment & Plan Note (Signed)
His [employer] DDD PM is working normally. Will recheck in several months. 

## 2015-03-12 NOTE — Assessment & Plan Note (Signed)
His blood pressure is slightly elevated. He is encouraged to reduce his sodium intake. If it remains high, would consider uptitration of his meds.

## 2015-03-12 NOTE — Assessment & Plan Note (Signed)
His is maintaining NSR mostly but does have episodes of atrial fib. His is minimally symptomatic. He will continue his current meds.

## 2015-03-12 NOTE — Patient Instructions (Signed)
Medication Instructions:  Your physician recommends that you continue on your current medications as directed. Please refer to the Current Medication list given to you today.   Labwork: None ordered  Testing/Procedures: None ordered  Follow-Up: Your physician wants you to follow-up in: 12 months with Dr Taylor You will receive a reminder letter in the mail two months in advance. If you don't receive a letter, please call our office to schedule the follow-up appointment.  Remote monitoring is used to monitor your Pacemaker or ICD from home. This monitoring reduces the number of office visits required to check your device to one time per year. It allows us to keep an eye on the functioning of your device to ensure it is working properly. You are scheduled for a device check from home on 06/14/15. You may send your transmission at any time that day. If you have a wireless device, the transmission will be sent automatically. After your physician reviews your transmission, you will receive a postcard with your next transmission date.     Any Other Special Instructions Will Be Listed Below (If Applicable).   

## 2015-03-12 NOTE — Progress Notes (Signed)
HPI Patrick Mccall returns today for followup. He is a very pleasant 68 year old man with symptomatic tachycardia bradycardia syndrome, paroxysmal atrial fibrillation, status post permanent pacemaker insertion. In the interim, the patient has been stable, benign chest pain or shortness of breath. No syncope. No peripheral edema. He has not experienced any palpitations. He is exercising almost daily for over an hour. No Known Allergies   Current Outpatient Prescriptions  Medication Sig Dispense Refill  . acetaminophen (TYLENOL) 500 MG tablet Take 500 mg by mouth every 6 (six) hours as needed for pain.    . fenofibrate micronized (LOFIBRA) 134 MG capsule Take 1 capsule by mouth daily.    . flecainide (TAMBOCOR) 50 MG tablet Take 2 tablets by mouth every 12 (twelve) hours.  11  . metoprolol tartrate (LOPRESSOR) 25 MG tablet TAKE THREE TABLETS BY MOUTH TWICE DAILY 180 tablet 3  . Multiple Vitamins-Minerals (MULTIVITAMIN WITH MINERALS) tablet Take 1 tablet by mouth daily.    . Omega-3 Fatty Acids (FISH OIL) 1000 MG CAPS Take 1 capsule by mouth daily.    . Rivaroxaban (XARELTO) 20 MG TABS Take 20 mg by mouth daily with supper.    . simvastatin (ZOCOR) 40 MG tablet Take 40 mg by mouth every evening.     No current facility-administered medications for this visit.     Past Medical History  Diagnosis Date  . PAF (paroxysmal atrial fibrillation)     aflutter ablation 02/2013, pacer implant  . Factor V Leiden deficiency   . HLD (hyperlipidemia)   . Hypertension   . Sleep apnea     ROS:   All systems reviewed and negative except as noted in the HPI.   Past Surgical History  Procedure Laterality Date  . Hernia repair    . Cardiac catheterization  02/2013    nl coronaries  . Pacemaker insertion      BS, pauses and sinus brady  . Left heart catheterization with coronary angiogram N/A 03/04/2013    Procedure: LEFT HEART CATHETERIZATION WITH CORONARY ANGIOGRAM;  Surgeon: Runell Gess, MD;   Location: [facility] CATH LAB;  Service: Cardiovascular;  Laterality: N/A;  . Atrial flutter ablation N/A 03/06/2013    Procedure: ATRIAL FLUTTER ABLATION;  Surgeon: Marinus Maw, MD;  Location: [facility] CATH LAB;  Service: Cardiovascular;  Laterality: N/A;  . Permanent pacemaker insertion N/A 03/06/2013    Procedure: PERMANENT PACEMAKER INSERTION;  Surgeon: Marinus Maw, MD;  Location[facility] C[facility] LAB;  Service: Cardiovascular;  Laterality: N/A;     Family History  Problem Relation Age of Onset  . Heart attack Mother   . Heart attack Father   . Heart attack Maternal Grandmother   . Heart attack Maternal Grandfather   . Heart attack Paternal Grandmother   . Heart attack Paternal Grandfather      History   Social History  . Marital Status: Married    Spouse Name: N/A  . Number of Children: N/A  . Years of Education: N/A   Occupational History  . Not on file.   Social History Main Topics  . Smoking status: Never Smoker   . Smokeless tobacco: Not on file  . Alcohol Use: 0.5 - 1.0 oz/week    1-2 drink(s) per week  . Drug Use: No  . Sexual Activity: Not on file   Other Topics Concern  . Not on file   Social History Narrative   Exercises five times per week doing cardio.     BP 142/84 mmHg  Pulse 69  Ht  (1.6 m)  Wt 134 lb (60.782 kg)  BMI 23.74 kg/m2  Physical Exam:  Well appearing 69 year old man,NAD HEENT: Unremarkable Neck:  6 cm JVD, no thyromegally Back:  No CVA tenderness Lungs:  Clear with no wheezes, rales, or rhonchi. Well-healed pacemaker incision. HEART:  Regular rate rhythm, no murmurs, no rubs, no clicks Abd:  soft, positive bowel sounds, no organomegally, no rebound, no guarding Ext:  2 plus pulses, no edema, no cyanosis, no clubbing Skin:  No rashes no nodules Neuro:  CN II through XII intact, motor grossly intact   DEVICE  Normal device function.  See PaceArt for details.   Assess/Plan:

## 2015-03-31 ENCOUNTER — Other Ambulatory Visit: Payer: Self-pay | Admitting: Internal Medicine

## 2015-04-03 ENCOUNTER — Other Ambulatory Visit: Payer: Self-pay | Admitting: Internal Medicine

## 2015-04-05 ENCOUNTER — Other Ambulatory Visit: Payer: Self-pay | Admitting: *Deleted

## 2015-04-05 DIAGNOSIS — I480 Paroxysmal atrial fibrillation: Secondary | ICD-10-CM

## 2015-04-05 MED ORDER — FLECAINIDE ACETATE 50 MG PO TABS: 100.0000 mg | ORAL_TABLET | Freq: Two times a day (BID) | ORAL | Status: DC

## 2015-05-28 ENCOUNTER — Other Ambulatory Visit: Payer: Self-pay | Admitting: Internal Medicine

## 2015-06-14 ENCOUNTER — Ambulatory Visit (INDEPENDENT_AMBULATORY_CARE_PROVIDER_SITE_OTHER): Payer: Medicare Other | Admitting: *Deleted

## 2015-06-14 DIAGNOSIS — I4892 Unspecified atrial flutter: Secondary | ICD-10-CM

## 2015-06-16 NOTE — Progress Notes (Signed)
Remote pacemaker transmission.   

## 2015-06-21 LAB — CUP PACEART REMOTE DEVICE CHECK
Battery Remaining Longevity: 120 mo
Battery Remaining Percentage: 100 %
Brady Statistic RA Percent Paced: 90 %
Brady Statistic RV Percent Paced: 6 %
Date Time Interrogation Session: 20160919090100
Lead Channel Impedance Value: 606 Ohm
Lead Channel Pacing Threshold Pulse Width: 0.6 ms
Lead Channel Setting Pacing Amplitude: 2.2 V
Lead Channel Setting Pacing Pulse Width: 0.4 ms
Lead Channel Setting Sensing Sensitivity: 2.5 mV
MDC IDC MSMT LEADCHNL RA IMPEDANCE VALUE: 664 Ohm
MDC IDC MSMT LEADCHNL RA PACING THRESHOLD AMPLITUDE: 1.1 V
MDC IDC PG SERIAL: 112353
MDC IDC SET LEADCHNL RV PACING AMPLITUDE: 2.4 V
MDC IDC SET ZONE DETECTION INTERVAL: 375 ms

## 2015-07-02 ENCOUNTER — Encounter: Payer: Self-pay | Admitting: Cardiology

## 2015-07-07 ENCOUNTER — Encounter: Payer: Self-pay | Admitting: Cardiology

## 2015-07-19 ENCOUNTER — Encounter: Payer: Self-pay | Admitting: Internal Medicine

## 2015-09-21 ENCOUNTER — Ambulatory Visit (INDEPENDENT_AMBULATORY_CARE_PROVIDER_SITE_OTHER): Payer: Medicare Other | Admitting: *Deleted

## 2015-09-21 DIAGNOSIS — I480 Paroxysmal atrial fibrillation: Secondary | ICD-10-CM

## 2015-09-21 NOTE — Progress Notes (Signed)
Remote pacemaker transmission.   

## 2015-10-11 LAB — CUP PACEART REMOTE DEVICE CHECK
Battery Remaining Longevity: 120 mo
Battery Remaining Percentage: 100 %
Brady Statistic RA Percent Paced: 91 %
Date Time Interrogation Session: 20161227100100
Implantable Lead Implant Date: 20140612
Implantable Lead Location: 753860
Implantable Lead Model: 4135
Implantable Lead Serial Number: 29410465
Lead Channel Impedance Value: 605 Ohm
Lead Channel Impedance Value: 655 Ohm
Lead Channel Sensing Intrinsic Amplitude: 13.2 mV
Lead Channel Setting Pacing Amplitude: 2.2 V
Lead Channel Setting Sensing Sensitivity: 2.5 mV
MDC IDC LEAD IMPLANT DT: 20140612
MDC IDC LEAD LOCATION: 753859
MDC IDC LEAD MODEL: 4136
MDC IDC LEAD SERIAL: 29414571
MDC IDC MSMT LEADCHNL RA SENSING INTR AMPL: 4.2 mV
MDC IDC SET LEADCHNL RV PACING AMPLITUDE: 2.4 V
MDC IDC SET LEADCHNL RV PACING PULSEWIDTH: 0.4 ms
MDC IDC STAT BRADY RV PERCENT PACED: 9 %
Pulse Gen Serial Number: 112353

## 2015-10-13 ENCOUNTER — Encounter: Payer: Self-pay | Admitting: Cardiology

## 2015-10-27 ENCOUNTER — Other Ambulatory Visit: Payer: Self-pay | Admitting: Internal Medicine

## 2015-10-27 NOTE — Telephone Encounter (Signed)
Would like patient's list of medication faxed to [phone] please.

## 2015-12-01 DIAGNOSIS — R7303 Prediabetes: Secondary | ICD-10-CM | POA: Diagnosis not present

## 2015-12-01 DIAGNOSIS — I4891 Unspecified atrial fibrillation: Secondary | ICD-10-CM | POA: Diagnosis not present

## 2015-12-01 DIAGNOSIS — Z6823 Body mass index (BMI) 23.0-23.9, adult: Secondary | ICD-10-CM | POA: Diagnosis not present

## 2015-12-01 DIAGNOSIS — E782 Mixed hyperlipidemia: Secondary | ICD-10-CM | POA: Diagnosis not present

## 2015-12-01 DIAGNOSIS — Z79899 Other long term (current) drug therapy: Secondary | ICD-10-CM | POA: Diagnosis not present

## 2015-12-01 DIAGNOSIS — I10 Essential (primary) hypertension: Secondary | ICD-10-CM | POA: Diagnosis not present

## 2015-12-01 DIAGNOSIS — D485 Neoplasm of uncertain behavior of skin: Secondary | ICD-10-CM | POA: Diagnosis not present

## 2015-12-21 ENCOUNTER — Ambulatory Visit (INDEPENDENT_AMBULATORY_CARE_PROVIDER_SITE_OTHER): Payer: Medicare Other | Admitting: *Deleted

## 2015-12-21 DIAGNOSIS — I480 Paroxysmal atrial fibrillation: Secondary | ICD-10-CM | POA: Diagnosis not present

## 2015-12-21 DIAGNOSIS — Z950 Presence of cardiac pacemaker: Secondary | ICD-10-CM

## 2015-12-21 NOTE — Progress Notes (Signed)
Remote pacemaker transmission.   

## 2016-01-21 ENCOUNTER — Encounter: Payer: Self-pay | Admitting: Cardiology

## 2016-01-21 LAB — CUP PACEART REMOTE DEVICE CHECK
Battery Remaining Longevity: 114 mo
Brady Statistic RA Percent Paced: 92 %
Brady Statistic RV Percent Paced: 9 %
Date Time Interrogation Session: 20170328090100
Implantable Lead Location: 753859
Implantable Lead Location: 753860
Implantable Lead Serial Number: 29410465
Implantable Lead Serial Number: 29414571
Lead Channel Impedance Value: 634 Ohm
Lead Channel Impedance Value: 692 Ohm
Lead Channel Setting Pacing Amplitude: 2.2 V
Lead Channel Setting Pacing Amplitude: 2.4 V
Lead Channel Setting Sensing Sensitivity: 2.5 mV
MDC IDC LEAD IMPLANT DT: 20140612
MDC IDC LEAD IMPLANT DT: 20140612
MDC IDC LEAD MODEL: 4135
MDC IDC LEAD MODEL: 4136
MDC IDC MSMT BATTERY REMAINING PERCENTAGE: 100 %
MDC IDC SET LEADCHNL RV PACING PULSEWIDTH: 0.4 ms
Pulse Gen Serial Number: 112353

## 2016-01-26 DIAGNOSIS — G4733 Obstructive sleep apnea (adult) (pediatric): Secondary | ICD-10-CM | POA: Diagnosis not present

## 2016-02-24 ENCOUNTER — Other Ambulatory Visit: Payer: Self-pay | Admitting: Internal Medicine

## 2016-03-16 ENCOUNTER — Encounter: Payer: Self-pay | Admitting: Internal Medicine

## 2016-03-16 ENCOUNTER — Ambulatory Visit (INDEPENDENT_AMBULATORY_CARE_PROVIDER_SITE_OTHER): Payer: Medicare Other | Admitting: Internal Medicine

## 2016-03-16 VITALS — BP 142/90 | HR 70 | Ht 63.0 in | Wt 133.8 lb

## 2016-03-16 DIAGNOSIS — I480 Paroxysmal atrial fibrillation: Secondary | ICD-10-CM | POA: Diagnosis not present

## 2016-03-16 DIAGNOSIS — Z950 Presence of cardiac pacemaker: Secondary | ICD-10-CM | POA: Diagnosis not present

## 2016-03-16 LAB — CUP PACEART INCLINIC DEVICE CHECK
Date Time Interrogation Session: 20170622144209
Implantable Lead Implant Date: 20140612
Implantable Lead Location: 753860
Implantable Lead Model: 4136
Implantable Lead Serial Number: 29410465
Implantable Lead Serial Number: 29414571
MDC IDC LEAD IMPLANT DT: 20140612
MDC IDC LEAD LOCATION: 753859
MDC IDC LEAD MODEL: 4135
Pulse Gen Serial Number: 112353

## 2016-03-16 NOTE — Progress Notes (Signed)
HPI [name] returns today for followup. He is a very pleasant 70 year old man with symptomatic tachycardia bradycardia syndrome, paroxysmal atrial fibrillation, status post permanent pacemaker insertion. In the interim, the patient has been stable, benign chest pain or shortness of breath. No syncope. No peripheral edema. He has not experienced any palpitations.  No Known Allergies   Current Outpatient Prescriptions  Medication Sig Dispense Refill  . acetaminophen (TYLENOL) 500 MG tablet Take 500 mg by mouth every 6 (six) hours as needed for pain.    . flecainide (TAMBOCOR) 100 MG tablet Take 1 tablet by mouth 2 (two) times daily.  11  . metoprolol tartrate (LOPRESSOR) 25 MG tablet TAKE THREE TABLETS BY MOUTH TWICE DAILY 180 tablet 0  . Multiple Vitamins-Minerals (MULTIVITAMIN WITH MINERALS) tablet Take 1 tablet by mouth daily.    . Omega-3 Fatty Acids (FISH OIL) 1000 MG CAPS Take 1 capsule by mouth daily.    . Rivaroxaban (XARELTO) 20 MG TABS Take 20 mg by mouth daily with supper.    . simvastatin (ZOCOR) 40 MG tablet Take 40 mg by mouth every evening.     No current facility-administered medications for this visit.     Past Medical History  Diagnosis Date  . PAF (paroxysmal atrial fibrillation) (HCC)     aflutter ablation 02/2013, pacer implant  . Factor V Leiden deficiency   . HLD (hyperlipidemia)   . Hypertension   . Sleep apnea     ROS:   All systems reviewed and negative except as noted in the HPI.   Past Surgical History  Procedure Laterality Date  . Hernia repair    . Cardiac catheterization  02/2013    nl coronaries  . Pacemaker insertion      BS, pauses and sinus brady  . Left heart catheterization with coronary angiogram N/A 03/04/2013    Procedure: LEFT HEART CATHETERIZATION WITH CORONARY ANGIOGRAM;  Surgeon: Runell GessJonathan J Berry, MD;  [facility];  Service: Cardiovascular;  Laterality: N/A;  . Atrial flutter ablation N/A 03/06/2013    Procedure: ATRIAL  FLUTTER ABLATION;  Surgeon: Marinus MawGregg W Weston Fulco, MD;  [facility];  Service: Cardiovascular;  Laterality: N/A;  . Permanent pacemaker insertion N/A 03/06/2013    Procedure: PERMANENT PACEMAKER INSERTION;  Surgeon: Marinus MawGregg W Mahnoor Mathisen, MD;  [facility];  Service: Cardiovascular;  Laterality: N/A;     Family History  Problem Relation Age of Onset  . Heart attack Mother   . Heart attack Father   . Heart attack Maternal Grandmother   . Heart attack Maternal Grandfather   . Heart attack Paternal Grandmother   . Heart attack Paternal Grandfather      Social History   Social History  . Marital Status: Married    Spouse Name: N/A  . Number of Children: N/A  . Years of Education: N/A   Occupational History  . Not on file.   Social History Main Topics  . Smoking status: Never Smoker   . Smokeless tobacco: Not on file  . Alcohol Use: 0.5 - 1.0 oz/week    1-2 drink(s) per week  . Drug Use: No  . Sexual Activity: Not on file   Other Topics Concern  . Not on file   Social History Narrative   Exercises five times per week doing cardio.     BP 142/90 mmHg  Pulse 70  Ht 5\' 3"  (1.6 m)  Wt 133 lb 12.8 oz (60.691 kg)  BMI 23.71 kg/m2  Physical  Exam:  Well appearing 70 year old man,NAD HEENT: Unremarkable Neck:  6 cm JVD, no thyromegally Back:  No CVA tenderness Lungs:  Clear with no wheezes, rales, or rhonchi. Well-healed pacemaker incision. HEART:  Regular rate rhythm, no murmurs, no rubs, no clicks Abd:  soft, positive bowel sounds, no organomegally, no rebound, no guarding Ext:  2 plus pulses, no edema, no cyanosis, no clubbing Skin:  No rashes no nodules Neuro:  CN II through XII intact, motor grossly intact  ECG - nsr with atrial pacing  DEVICE  Normal device function.  See PaceArt for details.   Assess/Plan: 1. PAF - he is maintaining NSR with flecainide. He will continue his current meds. 2. HTN - he will continue his beta blocker 3. PPM - his [employer] DDD PM is working normally. Will follow.  Leonia ReevesGregg Abia Mccall,M.D.

## 2016-03-16 NOTE — Patient Instructions (Addendum)
Medication Instructions:  Your physician recommends that you continue on your current medications as directed. Please refer to the Current Medication list given to you today.  Labwork: None ordered  Testing/Procedures: None ordered  Follow-Up: Remote monitoring is used to monitor your Pacemaker of ICD from home. This monitoring reduces the number of office visits required to check your device to one time per year. It allows us to keep an eye on the functioning of your device to ensure it is working properly. You are scheduled for a device check from home on 06/15/16. You may send your transmission at any time that day. If you have a wireless device, the transmission will be sent automatically. After your physician reviews your transmission, you will receive a postcard with your next transmission date.  Your physician wants you to follow-up in: 1 year with Dr. Taylor.  You will receive a reminder letter in the mail two months in advance. If you don't receive a letter, please call our office to schedule the follow-up appointment.  If you need a refill on your cardiac medications before your next appointment, please call your pharmacy.  Thank you for choosing [facility]!!         

## 2016-04-05 ENCOUNTER — Other Ambulatory Visit: Payer: Self-pay | Admitting: Internal Medicine

## 2016-06-05 DIAGNOSIS — R7303 Prediabetes: Secondary | ICD-10-CM | POA: Diagnosis not present

## 2016-06-05 DIAGNOSIS — I10 Essential (primary) hypertension: Secondary | ICD-10-CM | POA: Diagnosis not present

## 2016-06-05 DIAGNOSIS — Z125 Encounter for screening for malignant neoplasm of prostate: Secondary | ICD-10-CM | POA: Diagnosis not present

## 2016-06-05 DIAGNOSIS — Z1389 Encounter for screening for other disorder: Secondary | ICD-10-CM | POA: Diagnosis not present

## 2016-06-05 DIAGNOSIS — I4891 Unspecified atrial fibrillation: Secondary | ICD-10-CM | POA: Diagnosis not present

## 2016-06-05 DIAGNOSIS — Z6823 Body mass index (BMI) 23.0-23.9, adult: Secondary | ICD-10-CM | POA: Diagnosis not present

## 2016-06-05 DIAGNOSIS — Z9181 History of falling: Secondary | ICD-10-CM | POA: Diagnosis not present

## 2016-06-05 DIAGNOSIS — Z79899 Other long term (current) drug therapy: Secondary | ICD-10-CM | POA: Diagnosis not present

## 2016-06-05 DIAGNOSIS — E782 Mixed hyperlipidemia: Secondary | ICD-10-CM | POA: Diagnosis not present

## 2016-06-15 ENCOUNTER — Telehealth: Payer: Self-pay | Admitting: Cardiology

## 2016-06-15 ENCOUNTER — Ambulatory Visit (INDEPENDENT_AMBULATORY_CARE_PROVIDER_SITE_OTHER): Payer: Medicare Other | Admitting: *Deleted

## 2016-06-15 DIAGNOSIS — I480 Paroxysmal atrial fibrillation: Secondary | ICD-10-CM | POA: Diagnosis not present

## 2016-06-15 NOTE — Telephone Encounter (Signed)
LMOVM reminding pt to send remote transmission.   

## 2016-06-15 NOTE — Progress Notes (Signed)
Remote pacemaker transmission.   

## 2016-06-16 ENCOUNTER — Encounter: Payer: Self-pay | Admitting: Cardiology

## 2016-07-05 DIAGNOSIS — Z23 Encounter for immunization: Secondary | ICD-10-CM | POA: Diagnosis not present

## 2016-07-06 LAB — CUP PACEART REMOTE DEVICE CHECK
Brady Statistic RA Percent Paced: 94 %
Implantable Lead Implant Date: 20140612
Implantable Lead Location: 753859
Implantable Lead Location: 753860
Implantable Lead Model: 4135
Implantable Lead Model: 4136
Implantable Lead Serial Number: 29414571
Lead Channel Impedance Value: 630 Ohm
Lead Channel Impedance Value: 690 Ohm
Lead Channel Pacing Threshold Amplitude: 0.9 V
Lead Channel Pacing Threshold Pulse Width: 0.6 ms
Lead Channel Setting Pacing Amplitude: 2.2 V
Lead Channel Setting Pacing Amplitude: 2.4 V
MDC IDC LEAD IMPLANT DT: 20140612
MDC IDC LEAD SERIAL: 29410465
MDC IDC MSMT BATTERY REMAINING LONGEVITY: 114 mo
MDC IDC MSMT BATTERY REMAINING PERCENTAGE: 100 %
MDC IDC SESS DTM: 20170921125300
MDC IDC SET LEADCHNL RV PACING PULSEWIDTH: 0.4 ms
MDC IDC SET LEADCHNL RV SENSING SENSITIVITY: 2.5 mV
MDC IDC STAT BRADY RV PERCENT PACED: 12 %
Pulse Gen Serial Number: 112353

## 2016-08-02 DIAGNOSIS — G4733 Obstructive sleep apnea (adult) (pediatric): Secondary | ICD-10-CM | POA: Diagnosis not present

## 2016-09-14 ENCOUNTER — Ambulatory Visit (INDEPENDENT_AMBULATORY_CARE_PROVIDER_SITE_OTHER): Payer: Medicare Other | Admitting: *Deleted

## 2016-09-14 DIAGNOSIS — I480 Paroxysmal atrial fibrillation: Secondary | ICD-10-CM | POA: Diagnosis not present

## 2016-09-14 NOTE — Progress Notes (Signed)
Remote pacemaker transmission.   

## 2016-09-22 LAB — CUP PACEART REMOTE DEVICE CHECK
Date Time Interrogation Session: 20171221100100
Implantable Lead Implant Date: 20140612
Implantable Lead Location: 753860
Implantable Lead Model: 4136
Implantable Lead Serial Number: 29414571
Implantable Pulse Generator Implant Date: 20140612
Lead Channel Impedance Value: 664 Ohm
Lead Channel Impedance Value: 699 Ohm
Lead Channel Pacing Threshold Amplitude: 0.9 V
Lead Channel Pacing Threshold Pulse Width: 0.6 ms
MDC IDC LEAD IMPLANT DT: 20140612
MDC IDC LEAD LOCATION: 753859
MDC IDC LEAD MODEL: 4135
MDC IDC LEAD SERIAL: 29410465
MDC IDC MSMT BATTERY REMAINING LONGEVITY: 108 mo
MDC IDC MSMT BATTERY REMAINING PERCENTAGE: 100 %
MDC IDC SET LEADCHNL RA PACING AMPLITUDE: 2.2 V
MDC IDC SET LEADCHNL RV PACING AMPLITUDE: 2.4 V
MDC IDC SET LEADCHNL RV PACING PULSEWIDTH: 0.4 ms
MDC IDC SET LEADCHNL RV SENSING SENSITIVITY: 2.5 mV
MDC IDC STAT BRADY RA PERCENT PACED: 94 %
MDC IDC STAT BRADY RV PERCENT PACED: 11 %
Pulse Gen Serial Number: 112353

## 2016-11-07 ENCOUNTER — Other Ambulatory Visit: Payer: Self-pay | Admitting: Internal Medicine

## 2016-11-13 DIAGNOSIS — H02811 Retained foreign body in right upper eyelid: Secondary | ICD-10-CM | POA: Diagnosis not present

## 2016-11-29 DIAGNOSIS — H2513 Age-related nuclear cataract, bilateral: Secondary | ICD-10-CM | POA: Diagnosis not present

## 2016-11-29 DIAGNOSIS — H40033 Anatomical narrow angle, bilateral: Secondary | ICD-10-CM | POA: Diagnosis not present

## 2016-12-14 ENCOUNTER — Encounter: Payer: Medicare Other | Admitting: *Deleted

## 2016-12-14 ENCOUNTER — Telehealth: Payer: Self-pay | Admitting: Cardiology

## 2016-12-14 ENCOUNTER — Ambulatory Visit (INDEPENDENT_AMBULATORY_CARE_PROVIDER_SITE_OTHER): Payer: Medicare Other | Admitting: *Deleted

## 2016-12-14 DIAGNOSIS — E782 Mixed hyperlipidemia: Secondary | ICD-10-CM | POA: Diagnosis not present

## 2016-12-14 DIAGNOSIS — I4891 Unspecified atrial fibrillation: Secondary | ICD-10-CM | POA: Diagnosis not present

## 2016-12-14 DIAGNOSIS — I480 Paroxysmal atrial fibrillation: Secondary | ICD-10-CM

## 2016-12-14 DIAGNOSIS — I10 Essential (primary) hypertension: Secondary | ICD-10-CM | POA: Diagnosis not present

## 2016-12-14 DIAGNOSIS — Z6823 Body mass index (BMI) 23.0-23.9, adult: Secondary | ICD-10-CM | POA: Diagnosis not present

## 2016-12-14 NOTE — Telephone Encounter (Signed)
Attempted to confirm remote transmission with pt. No answer and was unable to leave a message.   

## 2016-12-15 ENCOUNTER — Encounter: Payer: Self-pay | Admitting: Cardiology

## 2016-12-21 ENCOUNTER — Encounter: Payer: Self-pay | Admitting: Cardiology

## 2016-12-21 NOTE — Progress Notes (Signed)
Remote pacemaker transmission.   

## 2016-12-25 ENCOUNTER — Telehealth: Payer: Self-pay | Admitting: Internal Medicine

## 2016-12-25 LAB — CUP PACEART REMOTE DEVICE CHECK
Battery Remaining Percentage: 100 %
Brady Statistic RA Percent Paced: 95 %
Date Time Interrogation Session: 20180322090100
Implantable Lead Location: 753860
Implantable Lead Model: 4136
Implantable Lead Serial Number: 29414571
Implantable Pulse Generator Implant Date: 20140612
Lead Channel Impedance Value: 638 Ohm
Lead Channel Setting Sensing Sensitivity: 2.5 mV
MDC IDC LEAD IMPLANT DT: 20140612
MDC IDC LEAD IMPLANT DT: 20140612
MDC IDC LEAD LOCATION: 753859
MDC IDC LEAD SERIAL: 29410465
MDC IDC MSMT BATTERY REMAINING LONGEVITY: 102 mo
MDC IDC MSMT LEADCHNL RA IMPEDANCE VALUE: 675 Ohm
MDC IDC PG SERIAL: 112353
MDC IDC SET LEADCHNL RA PACING AMPLITUDE: 2.2 V
MDC IDC SET LEADCHNL RV PACING AMPLITUDE: 2.4 V
MDC IDC SET LEADCHNL RV PACING PULSEWIDTH: 0.4 ms
MDC IDC STAT BRADY RV PERCENT PACED: 11 %

## 2016-12-25 NOTE — Telephone Encounter (Signed)
error 

## 2017-02-16 ENCOUNTER — Other Ambulatory Visit: Payer: Self-pay | Admitting: Internal Medicine

## 2017-02-16 ENCOUNTER — Other Ambulatory Visit: Payer: Self-pay

## 2017-02-16 DIAGNOSIS — I480 Paroxysmal atrial fibrillation: Secondary | ICD-10-CM

## 2017-02-16 MED ORDER — FLECAINIDE ACETATE 100 MG PO TABS: 100.0000 mg | ORAL_TABLET | Freq: Two times a day (BID) | ORAL | 11 refills | Status: DC

## 2017-03-07 DIAGNOSIS — G4733 Obstructive sleep apnea (adult) (pediatric): Secondary | ICD-10-CM | POA: Diagnosis not present

## 2017-03-22 ENCOUNTER — Encounter: Payer: Self-pay | Admitting: Internal Medicine

## 2017-03-22 ENCOUNTER — Ambulatory Visit (INDEPENDENT_AMBULATORY_CARE_PROVIDER_SITE_OTHER): Payer: Medicare Other | Admitting: Internal Medicine

## 2017-03-22 VITALS — BP 150/98 | HR 76 | Ht 63.0 in | Wt 133.6 lb

## 2017-03-22 DIAGNOSIS — I480 Paroxysmal atrial fibrillation: Secondary | ICD-10-CM | POA: Diagnosis not present

## 2017-03-22 DIAGNOSIS — Z950 Presence of cardiac pacemaker: Secondary | ICD-10-CM | POA: Diagnosis not present

## 2017-03-22 NOTE — Patient Instructions (Signed)
Medication Instructions:  Your physician recommends that you continue on your current medications as directed. Please refer to the Current Medication list given to you today.   Labwork: None Ordered   Testing/Procedures: None Ordered   Follow-Up: Your physician wants you to follow-up in: 1 year with Dr. Taylor. You will receive a reminder letter in the mail two months in advance. If you don't receive a letter, please call our office to schedule the follow-up appointment.  Remote monitoring is used to monitor your Pacemaker from home. This monitoring reduces the number of office visits required to check your device to one time per year. It allows us to keep an eye on the functioning of your device to ensure it is working properly. You are scheduled for a device check from home on  06/21/17 . You may send your transmission at any time that day. If you have a wireless device, the transmission will be sent automatically. After your physician reviews your transmission, you will receive a postcard with your next transmission date.    Any Other Special Instructions Will Be Listed Below (If Applicable).     If you need a refill on your cardiac medications before your next appointment, please call your pharmacy.   

## 2017-03-22 NOTE — Progress Notes (Signed)
HPI [name] returns today for followup. He is a very pleasant 71 year old man with symptomatic tachycardia bradycardia syndrome, paroxysmal atrial fibrillation, status post permanent pacemaker insertion. In the interim he has done well. He is exercising 5 times a week, walking almost 4 miles every day. He denies palpitations or syncope. No edema. He notes that he was anxious about coming to see the doctor today.   Current Outpatient Prescriptions  Medication Sig Dispense Refill  . acetaminophen (TYLENOL) 500 MG tablet Take 500 mg by mouth every 6 (six) hours as needed for pain.    . flecainide (TAMBOCOR) 100 MG tablet TAKE ONE TABLET BY MOUTH EVERY TWELVE HOURS 60 tablet 0  . metoprolol tartrate (LOPRESSOR) 100 MG tablet Take 100 mg by mouth 2 (two) times daily.  5  . Multiple Vitamins-Minerals (MULTIVITAMIN WITH MINERALS) tablet Take 1 tablet by mouth daily.    . Omega-3 Fatty Acids (FISH OIL) 1000 MG CAPS Take 1 capsule by mouth daily.    . Rivaroxaban (XARELTO) 20 MG TABS Take 20 mg by mouth daily with supper.    . simvastatin (ZOCOR) 40 MG tablet Take 40 mg by mouth every evening.     No current facility-administered medications for this visit.      Past Medical History:  Diagnosis Date  . Factor V Leiden deficiency   . HLD (hyperlipidemia)   . Hypertension   . PAF (paroxysmal atrial fibrillation) (HCC)    aflutter ablation 02/2013, pacer implant  . Sleep apnea     ROS:   All systems reviewed and negative except as noted in the HPI.   Past Surgical History:  Procedure Laterality Date  . ATRIAL FLUTTER ABLATION N/A 03/06/2013   Procedure: ATRIAL FLUTTER ABLATION;  Surgeon: Marinus MawGregg W Taylor, MD;  [facility];  Service: Cardiovascular;  Laterality: N/A;  . CARDIAC CATHETERIZATION  02/2013   nl coronaries  . HERNIA REPAIR    . LEFT HEART CATHETERIZATION WITH CORONARY ANGIOGRAM N/A 03/04/2013   Procedure: LEFT HEART CATHETERIZATION WITH CORONARY ANGIOGRAM;  Surgeon:  Runell GessJonathan J Berry, MD;  [facility];  Service: Cardiovascular;  Laterality: N/A;  . PACEMAKER INSERTION     BS, pauses and sinus brady  . PERMANENT PACEMAKER INSERTION N/A 03/06/2013   Procedure: PERMANENT PACEMAKER INSERTION;  Surgeon: Marinus MawGregg W Taylor, MD;  [facility];  Service: Cardiovascular;  Laterality: N/A;     Family History  Problem Relation Age of Onset  . Heart attack Mother   . Heart attack Father   . Heart attack Maternal Grandfather   . Heart attack Maternal Grandmother   . Heart attack Paternal Grandfather   . Heart attack Paternal Grandmother      Social History   Social History  . Marital status: Married    Spouse name: N/A  . Number of children: N/A  . Years of education: N/A   Occupational History  . Not on file.   Social History Main Topics  . Smoking status: Never Smoker  . Smokeless tobacco: Never Used  . Alcohol use 0.5 - 1.0 oz/week    1 - 2 Standard drinks or equivalent per week  . Drug use: No  . Sexual activity: Not on file   Other Topics Concern  . Not on file   Social History Narrative   Exercises five times per week doing cardio.     BP (!) 150/98   Pulse 76   Ht 5\' 3"  (1.6 m)   Wt  133 lb 9.6 oz (60.6 kg)   SpO2 96%   BMI 23.67 kg/m   Physical Exam:  Well appearing 71 year old man,NAD HEENT: Unremarkable Neck:  5 cm JVD, no thyromegally Back:  No CVA tenderness Lungs:  Clear with no wheezes, rales, or rhonchi. Well-healed pacemaker incision. No increased work of breathing. HEART:  Regular rate rhythm, no murmurs, no rubs, no clicks Abd:  soft, positive bowel sounds, no organomegally, no rebound, no guarding Ext:  2 plus pulses, no edema, no cyanosis, no clubbing Skin:  No rashes no nodules Neuro:  CN II through XII intact, motor grossly intact  ECG - nsr with atrial pacing  DEVICE  Normal device function.  See PaceArt for details.   Assess/Plan: 1. PAF - he is maintaining NSR with flecainide. He  will continue his current meds. 2. HTN - he will continue his beta blocker. I considered increasing his medications but will hold off as he has a component of white coat HTN. 3. PPM - his [employer] DDD PM is working normally. Will follow. 4. Coags - he will continue his xarelto. No bleeding.  Leonia Reeves.D.

## 2017-04-08 ENCOUNTER — Other Ambulatory Visit: Payer: Self-pay | Admitting: Internal Medicine

## 2017-06-21 ENCOUNTER — Ambulatory Visit (INDEPENDENT_AMBULATORY_CARE_PROVIDER_SITE_OTHER): Payer: Medicare Other | Admitting: *Deleted

## 2017-06-21 DIAGNOSIS — I480 Paroxysmal atrial fibrillation: Secondary | ICD-10-CM | POA: Diagnosis not present

## 2017-06-21 NOTE — Progress Notes (Signed)
Remote pacemaker transmission.   

## 2017-06-25 DIAGNOSIS — E782 Mixed hyperlipidemia: Secondary | ICD-10-CM | POA: Diagnosis not present

## 2017-06-25 DIAGNOSIS — Z1389 Encounter for screening for other disorder: Secondary | ICD-10-CM | POA: Diagnosis not present

## 2017-06-25 DIAGNOSIS — I4891 Unspecified atrial fibrillation: Secondary | ICD-10-CM | POA: Diagnosis not present

## 2017-06-25 DIAGNOSIS — I10 Essential (primary) hypertension: Secondary | ICD-10-CM | POA: Diagnosis not present

## 2017-06-26 ENCOUNTER — Encounter: Payer: Self-pay | Admitting: Cardiology

## 2017-07-03 LAB — CUP PACEART REMOTE DEVICE CHECK
Battery Remaining Longevity: 102 mo
Date Time Interrogation Session: 20180927090100
Implantable Lead Implant Date: 20140612
Implantable Lead Location: 753860
Implantable Lead Model: 4135
Implantable Lead Model: 4136
Implantable Lead Serial Number: 29410465
Implantable Pulse Generator Implant Date: 20140612
Lead Channel Impedance Value: 634 Ohm
Lead Channel Impedance Value: 697 Ohm
Lead Channel Pacing Threshold Amplitude: 1 V
Lead Channel Setting Pacing Amplitude: 2.2 V
Lead Channel Setting Sensing Sensitivity: 2.5 mV
MDC IDC LEAD IMPLANT DT: 20140612
MDC IDC LEAD LOCATION: 753859
MDC IDC LEAD SERIAL: 29414571
MDC IDC MSMT BATTERY REMAINING PERCENTAGE: 100 %
MDC IDC MSMT LEADCHNL RA PACING THRESHOLD PULSEWIDTH: 0.6 ms
MDC IDC SET LEADCHNL RV PACING AMPLITUDE: 2.4 V
MDC IDC SET LEADCHNL RV PACING PULSEWIDTH: 0.4 ms
MDC IDC STAT BRADY RA PERCENT PACED: 95 %
MDC IDC STAT BRADY RV PERCENT PACED: 16 %
Pulse Gen Serial Number: 112353

## 2017-09-21 ENCOUNTER — Ambulatory Visit (INDEPENDENT_AMBULATORY_CARE_PROVIDER_SITE_OTHER): Payer: Medicare Other | Admitting: *Deleted

## 2017-09-21 DIAGNOSIS — I480 Paroxysmal atrial fibrillation: Secondary | ICD-10-CM

## 2017-09-21 NOTE — Progress Notes (Signed)
Remote pacemaker transmission.   

## 2017-09-24 ENCOUNTER — Encounter: Payer: Self-pay | Admitting: Cardiology

## 2017-10-09 LAB — CUP PACEART REMOTE DEVICE CHECK
Battery Remaining Longevity: 96 mo
Battery Remaining Percentage: 100 %
Brady Statistic RV Percent Paced: 14 %
Implantable Lead Implant Date: 20140612
Implantable Lead Location: 753860
Implantable Lead Model: 4135
Implantable Lead Model: 4136
Implantable Lead Serial Number: 29414571
Lead Channel Impedance Value: 594 Ohm
Lead Channel Pacing Threshold Amplitude: 1 V
Lead Channel Pacing Threshold Pulse Width: 0.6 ms
Lead Channel Setting Pacing Pulse Width: 0.4 ms
Lead Channel Setting Sensing Sensitivity: 2.5 mV
MDC IDC LEAD IMPLANT DT: 20140612
MDC IDC LEAD LOCATION: 753859
MDC IDC LEAD SERIAL: 29410465
MDC IDC MSMT LEADCHNL RA IMPEDANCE VALUE: 664 Ohm
MDC IDC PG IMPLANT DT: 20140612
MDC IDC PG SERIAL: 112353
MDC IDC SESS DTM: 20181228100100
MDC IDC SET LEADCHNL RA PACING AMPLITUDE: 2.2 V
MDC IDC SET LEADCHNL RV PACING AMPLITUDE: 2.4 V
MDC IDC STAT BRADY RA PERCENT PACED: 95 %

## 2017-11-01 DIAGNOSIS — G4733 Obstructive sleep apnea (adult) (pediatric): Secondary | ICD-10-CM | POA: Diagnosis not present

## 2017-12-24 DIAGNOSIS — I10 Essential (primary) hypertension: Secondary | ICD-10-CM | POA: Diagnosis not present

## 2017-12-24 DIAGNOSIS — I4891 Unspecified atrial fibrillation: Secondary | ICD-10-CM | POA: Diagnosis not present

## 2017-12-24 DIAGNOSIS — E782 Mixed hyperlipidemia: Secondary | ICD-10-CM | POA: Diagnosis not present

## 2017-12-24 DIAGNOSIS — G252 Other specified forms of tremor: Secondary | ICD-10-CM | POA: Diagnosis not present

## 2017-12-25 ENCOUNTER — Ambulatory Visit (INDEPENDENT_AMBULATORY_CARE_PROVIDER_SITE_OTHER): Payer: Medicare Other | Admitting: *Deleted

## 2017-12-25 DIAGNOSIS — I480 Paroxysmal atrial fibrillation: Secondary | ICD-10-CM | POA: Diagnosis not present

## 2017-12-25 NOTE — Progress Notes (Signed)
Remote pacemaker transmission.   

## 2017-12-26 ENCOUNTER — Encounter: Payer: Self-pay | Admitting: Cardiology

## 2018-01-15 LAB — CUP PACEART REMOTE DEVICE CHECK
Battery Remaining Percentage: 100 %
Brady Statistic RA Percent Paced: 95 %
Date Time Interrogation Session: 20190402090100
Implantable Lead Implant Date: 20140612
Implantable Lead Implant Date: 20140612
Implantable Lead Location: 753859
Implantable Lead Serial Number: 29410465
Implantable Pulse Generator Implant Date: 20140612
Lead Channel Impedance Value: 631 Ohm
Lead Channel Impedance Value: 713 Ohm
Lead Channel Setting Pacing Amplitude: 2.2 V
Lead Channel Setting Pacing Amplitude: 2.4 V
MDC IDC LEAD LOCATION: 753860
MDC IDC LEAD SERIAL: 29414571
MDC IDC MSMT BATTERY REMAINING LONGEVITY: 96 mo
MDC IDC MSMT LEADCHNL RA PACING THRESHOLD AMPLITUDE: 1 V
MDC IDC MSMT LEADCHNL RA PACING THRESHOLD PULSEWIDTH: 0.6 ms
MDC IDC PG SERIAL: 112353
MDC IDC SET LEADCHNL RV PACING PULSEWIDTH: 0.4 ms
MDC IDC SET LEADCHNL RV SENSING SENSITIVITY: 2.5 mV
MDC IDC STAT BRADY RV PERCENT PACED: 12 %

## 2018-03-25 DIAGNOSIS — Z0000 Encounter for general adult medical examination without abnormal findings: Secondary | ICD-10-CM | POA: Diagnosis not present

## 2018-03-26 ENCOUNTER — Ambulatory Visit (INDEPENDENT_AMBULATORY_CARE_PROVIDER_SITE_OTHER): Payer: Medicare Other | Admitting: *Deleted

## 2018-03-26 DIAGNOSIS — I4892 Unspecified atrial flutter: Secondary | ICD-10-CM

## 2018-03-26 DIAGNOSIS — I495 Sick sinus syndrome: Secondary | ICD-10-CM

## 2018-03-26 NOTE — Progress Notes (Signed)
Remote pacemaker transmission.   

## 2018-04-03 ENCOUNTER — Other Ambulatory Visit: Payer: Self-pay | Admitting: Internal Medicine

## 2018-04-20 DIAGNOSIS — S60562A Insect bite (nonvenomous) of left hand, initial encounter: Secondary | ICD-10-CM | POA: Diagnosis not present

## 2018-04-20 DIAGNOSIS — M25542 Pain in joints of left hand: Secondary | ICD-10-CM | POA: Diagnosis not present

## 2018-04-20 DIAGNOSIS — M25442 Effusion, left hand: Secondary | ICD-10-CM | POA: Diagnosis not present

## 2018-04-23 ENCOUNTER — Encounter (INDEPENDENT_AMBULATORY_CARE_PROVIDER_SITE_OTHER): Payer: Self-pay

## 2018-04-23 ENCOUNTER — Ambulatory Visit: Payer: Medicare Other | Admitting: Internal Medicine

## 2018-04-23 ENCOUNTER — Encounter: Payer: Self-pay | Admitting: Internal Medicine

## 2018-04-23 VITALS — BP 140/78 | HR 70 | Ht 63.0 in | Wt 134.0 lb

## 2018-04-23 DIAGNOSIS — Z950 Presence of cardiac pacemaker: Secondary | ICD-10-CM

## 2018-04-23 DIAGNOSIS — I480 Paroxysmal atrial fibrillation: Secondary | ICD-10-CM | POA: Diagnosis not present

## 2018-04-23 DIAGNOSIS — I10 Essential (primary) hypertension: Secondary | ICD-10-CM

## 2018-04-23 DIAGNOSIS — I495 Sick sinus syndrome: Secondary | ICD-10-CM | POA: Diagnosis not present

## 2018-04-23 NOTE — Progress Notes (Signed)
HPI Mr. Shon BatonBrooks returns today for followup. He is a very pleasant 72 year old man with symptomatic tachycardia bradycardia syndrome, paroxysmal atrial fibrillation, status post permanent pacemaker insertion. In the interim he has done well. He is exercising 5 times a week, walking almost 4 miles every day. He denies palpitations or syncope. No edema. He notes that he was bitten by a wasp. His hand became swollen and he had to have his ring cut off.   No Known Allergies   Current Outpatient Medications  Medication Sig Dispense Refill  . acetaminophen (TYLENOL) 500 MG tablet Take 500 mg by mouth every 6 (six) hours as needed for pain.    . flecainide (TAMBOCOR) 100 MG tablet Take 1 tablet (100 mg total) by mouth 2 (two) times daily. Please keep upcoming appt in July for future refills. Thank you 180 tablet 0  . metoprolol tartrate (LOPRESSOR) 100 MG tablet Take 100 mg by mouth 2 (two) times daily.  5  . Multiple Vitamins-Minerals (MULTIVITAMIN WITH MINERALS) tablet Take 1 tablet by mouth daily.    . Omega-3 Fatty Acids (FISH OIL) 1000 MG CAPS Take 1 capsule by mouth daily.    . Rivaroxaban (XARELTO) 20 MG TABS Take 20 mg by mouth daily with supper.    . simvastatin (ZOCOR) 40 MG tablet Take 40 mg by mouth every evening.     No current facility-administered medications for this visit.      Past Medical History:  Diagnosis Date  . Factor V Leiden deficiency   . HLD (hyperlipidemia)   . Hypertension   . PAF (paroxysmal atrial fibrillation) (HCC)    aflutter ablation 02/2013, pacer implant  . Sleep apnea     ROS:   All systems reviewed and negative except as noted in the HPI.   Past Surgical History:  Procedure Laterality Date  . ATRIAL FLUTTER ABLATION N/A 03/06/2013   Procedure: ATRIAL FLUTTER ABLATION;  Surgeon: Marinus MawGregg W Taylor, MD;  [facility];  Service: Cardiovascular;  Laterality: N/A;  . CARDIAC CATHETERIZATION  02/2013   nl coronaries  . HERNIA REPAIR    .  LEFT HEART CATHETERIZATION WITH CORONARY ANGIOGRAM N/A 03/04/2013   Procedure: LEFT HEART CATHETERIZATION WITH CORONARY ANGIOGRAM;  Surgeon: Runell GessJonathan J Berry, MD;  [facility];  Service: Cardiovascular;  Laterality: N/A;  . PACEMAKER INSERTION     BS, pauses and sinus brady  . PERMANENT PACEMAKER INSERTION N/A 03/06/2013   Procedure: PERMANENT PACEMAKER INSERTION;  Surgeon: Marinus MawGregg W Taylor, MD;  [facility];  Service: Cardiovascular;  Laterality: N/A;     Family History  Problem Relation Age of Onset  . Heart attack Mother   . Heart attack Father   . Heart attack Maternal Grandfather   . Heart attack Maternal Grandmother   . Heart attack Paternal Grandfather   . Heart attack Paternal Grandmother      Social History   Socioeconomic History  . Marital status: Married    Spouse name: Not on file  . Number of children: Not on file  . Years of education: Not on file  . Highest education level: Not on file  Occupational History  . Not on file  Social Needs  . Financial resource strain: Not on file  . Food insecurity:    Worry: Not on file    Inability: Not on file  . Transportation needs:    Medical: Not on file    Non-medical: Not on file  Tobacco  Use  . Smoking status: Never Smoker  . Smokeless tobacco: Never Used  Substance and Sexual Activity  . Alcohol use: Yes    Alcohol/week: 0.6 - 1.2 oz    Types: 1 - 2 Standard drinks or equivalent per week  . Drug use: No  . Sexual activity: Not on file  Lifestyle  . Physical activity:    Days per week: Not on file    Minutes per session: Not on file  . Stress: Not on file  Relationships  . Social connections:    Talks on phone: Not on file    Gets together: Not on file    Attends religious service: Not on file    Active member of club or organization: Not on file    Attends meetings of clubs or organizations: Not on file    Relationship status: Not on file  . Intimate partner violence:    Fear of  current or ex partner: Not on file    Emotionally abused: Not on file    Physically abused: Not on file    Forced sexual activity: Not on file  Other Topics Concern  . Not on file  Social History Narrative   Exercises five times per week doing cardio.     BP 140/78   Pulse 70   Ht 5\' 3"  (1.6 m)   Wt 134 lb (60.8 kg)   BMI 23.74 kg/m   Physical Exam:  Well appearing 72 yo man, NAD HEENT: Unremarkable Neck:  6 cm JVD, no thyromegally Lymphatics:  No adenopathy Back:  No CVA tenderness Lungs:  Clear with no wheezes HEART:  Regular rate rhythm, no murmurs, no rubs, no clicks Abd:  soft, positive bowel sounds, no organomegally, no rebound, no guarding Ext:  2 plus pulses, no edema, no cyanosis, no clubbing Skin:  No rashes no nodules Neuro:  CN II through XII intact, motor grossly intact  EKG - NSR with atrial pacing. Early repolarization abnormality  DEVICE  Normal device function.  See PaceArt for details.   Assess/Plan: 1. Sinus node dysfunction - he is asymptomatic, s/p PPM insertion and feels well. His underlying HR today was in the 30's. 2. PAF - he is maintaining NSR 99.9% of the time.  3. PPM - his [employer] DDD PM is working normally.  4. HTN - his blood pressure is weill controlled. No change in his meds. He will continue his exercise program.  Leonia Reeves.D.

## 2018-04-23 NOTE — Patient Instructions (Signed)

## 2018-04-24 LAB — CUP PACEART REMOTE DEVICE CHECK
Date Time Interrogation Session: 20190702090000
Implantable Lead Implant Date: 20140612
Implantable Lead Location: 753859
Implantable Lead Location: 753860
Implantable Lead Model: 4136
Implantable Lead Serial Number: 29414571
Implantable Pulse Generator Implant Date: 20140612
Lead Channel Impedance Value: 603 Ohm
Lead Channel Impedance Value: 680 Ohm
Lead Channel Pacing Threshold Pulse Width: 0.6 ms
Lead Channel Setting Pacing Amplitude: 2.4 V
MDC IDC LEAD IMPLANT DT: 20140612
MDC IDC LEAD SERIAL: 29410465
MDC IDC MSMT BATTERY REMAINING LONGEVITY: 90 mo
MDC IDC MSMT BATTERY REMAINING PERCENTAGE: 94 %
MDC IDC MSMT LEADCHNL RA PACING THRESHOLD AMPLITUDE: 1 V
MDC IDC SET LEADCHNL RA PACING AMPLITUDE: 2.2 V
MDC IDC SET LEADCHNL RV PACING PULSEWIDTH: 0.4 ms
MDC IDC SET LEADCHNL RV SENSING SENSITIVITY: 2.5 mV
MDC IDC STAT BRADY RA PERCENT PACED: 95 %
MDC IDC STAT BRADY RV PERCENT PACED: 13 %
Pulse Gen Serial Number: 112353

## 2018-06-05 DIAGNOSIS — G4733 Obstructive sleep apnea (adult) (pediatric): Secondary | ICD-10-CM | POA: Diagnosis not present

## 2018-06-24 DIAGNOSIS — Z23 Encounter for immunization: Secondary | ICD-10-CM | POA: Diagnosis not present

## 2018-06-24 LAB — CUP PACEART INCLINIC DEVICE CHECK
Implantable Lead Implant Date: 20140612
Implantable Lead Location: 753859
Implantable Lead Location: 753860
Implantable Lead Model: 4135
Implantable Lead Model: 4136
Implantable Lead Serial Number: 29410465
Implantable Pulse Generator Implant Date: 20140612
MDC IDC LEAD IMPLANT DT: 20140612
MDC IDC LEAD SERIAL: 29414571
MDC IDC SESS DTM: 20190930123323
Pulse Gen Serial Number: 112353

## 2018-06-25 ENCOUNTER — Ambulatory Visit (INDEPENDENT_AMBULATORY_CARE_PROVIDER_SITE_OTHER): Payer: Medicare Other | Admitting: *Deleted

## 2018-06-25 DIAGNOSIS — I4892 Unspecified atrial flutter: Secondary | ICD-10-CM

## 2018-06-25 DIAGNOSIS — I495 Sick sinus syndrome: Secondary | ICD-10-CM

## 2018-06-25 NOTE — Progress Notes (Signed)
Remote pacemaker transmission.   

## 2018-07-02 ENCOUNTER — Other Ambulatory Visit: Payer: Self-pay | Admitting: Internal Medicine

## 2018-07-19 LAB — CUP PACEART REMOTE DEVICE CHECK
Brady Statistic RA Percent Paced: 98 %
Brady Statistic RV Percent Paced: 28 %
Date Time Interrogation Session: 20191025101501
Implantable Lead Implant Date: 20140612
Implantable Lead Location: 753860
Implantable Lead Model: 4136
Implantable Lead Serial Number: 29410465
Lead Channel Setting Pacing Amplitude: 2.2 V
Lead Channel Setting Pacing Pulse Width: 0.4 ms
Lead Channel Setting Sensing Sensitivity: 2.5 mV
MDC IDC LEAD IMPLANT DT: 20140612
MDC IDC LEAD LOCATION: 753859
MDC IDC LEAD SERIAL: 29414571
MDC IDC MSMT BATTERY REMAINING LONGEVITY: 78 mo
MDC IDC MSMT LEADCHNL RA IMPEDANCE VALUE: 710 Ohm
MDC IDC MSMT LEADCHNL RV IMPEDANCE VALUE: 631 Ohm
MDC IDC MSMT LEADCHNL RV SENSING INTR AMPL: 15.1 mV
MDC IDC PG IMPLANT DT: 20140612
MDC IDC SET LEADCHNL RV PACING AMPLITUDE: 2.4 V
Pulse Gen Serial Number: 112353

## 2018-09-11 DIAGNOSIS — I10 Essential (primary) hypertension: Secondary | ICD-10-CM | POA: Diagnosis not present

## 2018-09-11 DIAGNOSIS — G252 Other specified forms of tremor: Secondary | ICD-10-CM | POA: Diagnosis not present

## 2018-09-11 DIAGNOSIS — I4891 Unspecified atrial fibrillation: Secondary | ICD-10-CM | POA: Diagnosis not present

## 2018-09-11 DIAGNOSIS — E782 Mixed hyperlipidemia: Secondary | ICD-10-CM | POA: Diagnosis not present

## 2018-09-24 ENCOUNTER — Ambulatory Visit (INDEPENDENT_AMBULATORY_CARE_PROVIDER_SITE_OTHER): Payer: Medicare Other

## 2018-09-24 DIAGNOSIS — I4892 Unspecified atrial flutter: Secondary | ICD-10-CM

## 2018-09-24 DIAGNOSIS — I495 Sick sinus syndrome: Secondary | ICD-10-CM | POA: Diagnosis not present

## 2018-09-24 LAB — CUP PACEART REMOTE DEVICE CHECK
Date Time Interrogation Session: 20191231114517
Implantable Lead Location: 753860
Implantable Lead Model: 4135
Implantable Lead Serial Number: 29410465
MDC IDC LEAD IMPLANT DT: 20140612
MDC IDC LEAD IMPLANT DT: 20140612
MDC IDC LEAD LOCATION: 753859
MDC IDC LEAD SERIAL: 29414571
MDC IDC PG IMPLANT DT: 20140612
Pulse Gen Serial Number: 112353

## 2018-09-24 NOTE — Progress Notes (Signed)
Remote pacemaker transmission.   

## 2018-10-22 DIAGNOSIS — H40033 Anatomical narrow angle, bilateral: Secondary | ICD-10-CM | POA: Diagnosis not present

## 2018-10-22 DIAGNOSIS — H2513 Age-related nuclear cataract, bilateral: Secondary | ICD-10-CM | POA: Diagnosis not present

## 2018-10-24 DIAGNOSIS — R748 Abnormal levels of other serum enzymes: Secondary | ICD-10-CM | POA: Diagnosis not present

## 2018-12-24 ENCOUNTER — Ambulatory Visit (INDEPENDENT_AMBULATORY_CARE_PROVIDER_SITE_OTHER): Payer: Medicare Other | Admitting: *Deleted

## 2018-12-24 ENCOUNTER — Other Ambulatory Visit: Payer: Self-pay

## 2018-12-24 DIAGNOSIS — I495 Sick sinus syndrome: Secondary | ICD-10-CM | POA: Diagnosis not present

## 2018-12-24 LAB — CUP PACEART REMOTE DEVICE CHECK
Battery Remaining Longevity: 78 mo
Battery Remaining Percentage: 82 %
Brady Statistic RA Percent Paced: 97 %
Date Time Interrogation Session: 20200331090000
Implantable Lead Implant Date: 20140612
Implantable Lead Implant Date: 20140612
Implantable Lead Location: 753859
Implantable Lead Location: 753860
Implantable Lead Model: 4135
Implantable Lead Model: 4136
Implantable Lead Serial Number: 29410465
Implantable Lead Serial Number: 29414571
Implantable Pulse Generator Implant Date: 20140612
Lead Channel Impedance Value: 615 Ohm
Lead Channel Impedance Value: 697 Ohm
Lead Channel Pacing Threshold Amplitude: 0.7 V
Lead Channel Pacing Threshold Pulse Width: 0.6 ms
Lead Channel Setting Pacing Amplitude: 2.2 V
Lead Channel Setting Pacing Amplitude: 2.4 V
Lead Channel Setting Pacing Pulse Width: 0.4 ms
Lead Channel Setting Sensing Sensitivity: 2.5 mV
MDC IDC STAT BRADY RV PERCENT PACED: 21 %
Pulse Gen Serial Number: 112353

## 2018-12-31 ENCOUNTER — Encounter: Payer: Self-pay | Admitting: Cardiology

## 2018-12-31 NOTE — Progress Notes (Signed)
Remote pacemaker transmission.   

## 2019-01-10 DIAGNOSIS — G4733 Obstructive sleep apnea (adult) (pediatric): Secondary | ICD-10-CM | POA: Diagnosis not present

## 2019-03-07 DIAGNOSIS — G4733 Obstructive sleep apnea (adult) (pediatric): Secondary | ICD-10-CM | POA: Diagnosis not present

## 2019-03-10 DIAGNOSIS — E782 Mixed hyperlipidemia: Secondary | ICD-10-CM | POA: Diagnosis not present

## 2019-03-10 DIAGNOSIS — I4891 Unspecified atrial fibrillation: Secondary | ICD-10-CM | POA: Diagnosis not present

## 2019-03-10 DIAGNOSIS — I10 Essential (primary) hypertension: Secondary | ICD-10-CM | POA: Diagnosis not present

## 2019-03-13 DIAGNOSIS — Z9181 History of falling: Secondary | ICD-10-CM | POA: Diagnosis not present

## 2019-03-13 DIAGNOSIS — Z125 Encounter for screening for malignant neoplasm of prostate: Secondary | ICD-10-CM | POA: Diagnosis not present

## 2019-03-13 DIAGNOSIS — Z1331 Encounter for screening for depression: Secondary | ICD-10-CM | POA: Diagnosis not present

## 2019-03-13 DIAGNOSIS — Z0000 Encounter for general adult medical examination without abnormal findings: Secondary | ICD-10-CM | POA: Diagnosis not present

## 2019-03-26 ENCOUNTER — Ambulatory Visit (INDEPENDENT_AMBULATORY_CARE_PROVIDER_SITE_OTHER): Payer: Medicare Other | Admitting: *Deleted

## 2019-03-26 DIAGNOSIS — I495 Sick sinus syndrome: Secondary | ICD-10-CM | POA: Diagnosis not present

## 2019-03-26 LAB — CUP PACEART REMOTE DEVICE CHECK
Battery Remaining Longevity: 72 mo
Battery Remaining Percentage: 77 %
Brady Statistic RA Percent Paced: 98 %
Brady Statistic RV Percent Paced: 26 %
Date Time Interrogation Session: 20200701084740
Implantable Lead Implant Date: 20140612
Implantable Lead Implant Date: 20140612
Implantable Lead Location: 753859
Implantable Lead Location: 753860
Implantable Lead Model: 4135
Implantable Lead Model: 4136
Implantable Lead Serial Number: 29410465
Implantable Lead Serial Number: 29414571
Implantable Pulse Generator Implant Date: 20140612
Lead Channel Impedance Value: 614 Ohm
Lead Channel Impedance Value: 681 Ohm
Lead Channel Pacing Threshold Amplitude: 0.7 V
Lead Channel Pacing Threshold Pulse Width: 0.6 ms
Lead Channel Setting Pacing Amplitude: 2.2 V
Lead Channel Setting Pacing Amplitude: 2.4 V
Lead Channel Setting Pacing Pulse Width: 0.4 ms
Lead Channel Setting Sensing Sensitivity: 2.5 mV
Pulse Gen Serial Number: 112353

## 2019-03-30 ENCOUNTER — Other Ambulatory Visit: Payer: Self-pay | Admitting: Internal Medicine

## 2019-04-04 ENCOUNTER — Encounter: Payer: Self-pay | Admitting: Cardiology

## 2019-04-04 NOTE — Progress Notes (Signed)
Remote pacemaker transmission.   

## 2019-06-23 DIAGNOSIS — Z23 Encounter for immunization: Secondary | ICD-10-CM | POA: Diagnosis not present

## 2019-06-24 ENCOUNTER — Ambulatory Visit: Payer: Medicare Other | Admitting: Internal Medicine

## 2019-06-24 ENCOUNTER — Other Ambulatory Visit: Payer: Self-pay

## 2019-06-24 ENCOUNTER — Encounter: Payer: Self-pay | Admitting: Internal Medicine

## 2019-06-24 VITALS — BP 154/90 | HR 70 | Ht 63.0 in | Wt 137.0 lb

## 2019-06-24 DIAGNOSIS — I480 Paroxysmal atrial fibrillation: Secondary | ICD-10-CM

## 2019-06-24 DIAGNOSIS — I495 Sick sinus syndrome: Secondary | ICD-10-CM

## 2019-06-24 DIAGNOSIS — Z950 Presence of cardiac pacemaker: Secondary | ICD-10-CM

## 2019-06-24 DIAGNOSIS — I10 Essential (primary) hypertension: Secondary | ICD-10-CM

## 2019-06-24 MED ORDER — FLECAINIDE ACETATE 100 MG PO TABS: 100.0000 mg | ORAL_TABLET | Freq: Two times a day (BID) | ORAL | 11 refills | Status: DC

## 2019-06-24 MED ORDER — RIVAROXABAN 20 MG PO TABS: 20.0000 mg | ORAL_TABLET | Freq: Every day | ORAL | 11 refills | Status: DC

## 2019-06-24 MED ORDER — METOPROLOL TARTRATE 100 MG PO TABS: 100.0000 mg | ORAL_TABLET | Freq: Two times a day (BID) | ORAL | 11 refills | Status: DC

## 2019-06-24 NOTE — Patient Instructions (Signed)

## 2019-06-24 NOTE — Progress Notes (Signed)
HPI [name] returns today for followup. He is a pleasant 73 yo man with PAF, symptomatic tachy-brady, s/p PPM insertion. He has retired but has taken a part time job. He denies sob or chest pain. Minimal palpitations. No edema.  No Known Allergies   Current Outpatient Medications  Medication Sig Dispense Refill  . acetaminophen (TYLENOL) 500 MG tablet Take 500 mg by mouth every 6 (six) hours as needed for pain.    . flecainide (TAMBOCOR) 100 MG tablet Take 1 tablet (100 mg total) by mouth 2 (two) times daily. 60 tablet 11  . metoprolol tartrate (LOPRESSOR) 100 MG tablet Take 1 tablet (100 mg total) by mouth 2 (two) times daily. 30 tablet 11  . Multiple Vitamins-Minerals (MULTIVITAMIN WITH MINERALS) tablet Take 1 tablet by mouth daily.    . Omega-3 Fatty Acids (FISH OIL) 1000 MG CAPS Take 1 capsule by mouth daily.    . rivaroxaban (XARELTO) 20 MG TABS tablet Take 1 tablet (20 mg total) by mouth daily with supper. 30 tablet 11  . simvastatin (ZOCOR) 40 MG tablet Take 40 mg by mouth every evening.     No current facility-administered medications for this visit.      Past Medical History:  Diagnosis Date  . Factor V Leiden deficiency   . HLD (hyperlipidemia)   . Hypertension   . PAF (paroxysmal atrial fibrillation) ([place])    aflutter ablation 02/2013, pacer implant  . Sleep apnea     ROS:   All systems reviewed and negative except as noted in the HPI.   Past Surgical History:  Procedure Laterality Date  . ATRIAL FLUTTER ABLATION N/A 03/06/2013   Procedure: ATRIAL FLUTTER ABLATION;  Surgeon: Evans Lance, MD;  [facility] LAB;  Service: Cardiovascular;  Laterality: N/A;  . CARDIAC CATHETERIZATION  02/2013   nl coronaries  . HERNIA REPAIR    . LEFT HEART CATHETERIZATION WITH CORONARY ANGIOGRAM N/A 03/04/2013   Procedure: LEFT HEART CATHETERIZATION WITH CORONARY ANGIOGRAM;  Surgeon: Lorretta Harp, MD;  Location: [facility] CATH LAB;  Service: Cardiovascular;  Laterality:  N/A;  . PACEMAKER INSERTION     BS, pauses and sinus brady  . PERMANENT PACEMAKER INSERTION N/A 03/06/2013   Procedure: PERMANENT PACEMAKER INSERTION;  Surgeon: Evans Lance, MD;  Location: [facility] C[facility] LAB;  Service: Cardiovascular;  Laterality: N/A;     Family History  Problem Relation Age of Onset  . Heart attack Mother   . Heart attack Father   . Heart attack Maternal Grandfather   . Heart attack Maternal Grandmother   . Heart attack Paternal Grandfather   . Heart attack Paternal Grandmother      Social History   Socioeconomic History  . Marital status: Married    Spouse name: Not on file  . Number of children: Not on file  . Years of education: Not on file  . Highest education level: Not on file  Occupational History  . Not on file  Social Needs  . Financial resource strain: Not on file  . Food insecurity    Worry: Not on file    Inability: Not on file  . Transportation needs    Medical: Not on file    Non-medical: Not on file  Tobacco Use  . Smoking status: Never Smoker  . Smokeless tobacco: Never Used  Substance and Sexual Activity  . Alcohol use: Yes    Alcohol/week: 1.0 - 2.0 standard drinks    Types: 1 -  2 Standard drinks or equivalent per week  . Drug use: No  . Sexual activity: Not on file  Lifestyle  . Physical activity    Days per week: Not on file    Minutes per session: Not on file  . Stress: Not on file  Relationships  . Social Musician on phone: Not on file    Gets together: Not on file    Attends religious service: Not on file    Active member of club or organization: Not on file    Attends meetings of clubs or organizations: Not on file    Relationship status: Not on file  . Intimate partner violence    Fear of current or ex partner: Not on file    Emotionally abused: Not on file    Physically abused: Not on file    Forced sexual activity: Not on file  Other Topics Concern  . Not on file  Social History Narrative    Exercises five times per week doing cardio.     BP (!) 154/90   Pulse 70   Ht 5\' 3"  (1.6 m)   Wt 137 lb (62.1 kg)   SpO2 97%   BMI 24.27 kg/m   Physical Exam:  Well appearing NAD HEENT: Unremarkable Neck:  No JVD, no thyromegally Lymphatics:  No adenopathy Back:  No CVA tenderness Lungs:  Clear with no wheezes HEART:  Regular rate rhythm, no murmurs, no rubs, no clicks Abd:  soft, positive bowel sounds, no organomegally, no rebound, no guarding Ext:  2 plus pulses, no edema, no cyanosis, no clubbing Skin:  No rashes no nodules Neuro:  CN II through XII intact, motor grossly intact  EKG - NSR atrial pacing  DEVICE  Normal device function.  See PaceArt for details.   Assess/Plan: 1. PAF - he is maintaining NSR. He will continue the flecainide and metoprolol 2. Coags - he has had no bleeding on xarelto 3. HTN - his SBP is high today. He notes that at home it is well controlled. He is encouraged to avoid salty food.  .D.

## 2019-06-25 DIAGNOSIS — H903 Sensorineural hearing loss, bilateral: Secondary | ICD-10-CM | POA: Diagnosis not present

## 2019-06-26 ENCOUNTER — Other Ambulatory Visit: Payer: Self-pay | Admitting: Internal Medicine

## 2019-06-26 ENCOUNTER — Ambulatory Visit (INDEPENDENT_AMBULATORY_CARE_PROVIDER_SITE_OTHER): Payer: Medicare Other | Admitting: *Deleted

## 2019-06-26 DIAGNOSIS — I495 Sick sinus syndrome: Secondary | ICD-10-CM

## 2019-06-26 LAB — CUP PACEART REMOTE DEVICE CHECK
Battery Remaining Longevity: 72 mo
Battery Remaining Percentage: 76 %
Brady Statistic RA Percent Paced: 100 %
Brady Statistic RV Percent Paced: 20 %
Date Time Interrogation Session: 20200930090100
Implantable Lead Implant Date: 20140612
Implantable Lead Implant Date: 20140612
Implantable Lead Location: 753859
Implantable Lead Location: 753860
Implantable Lead Model: 4135
Implantable Lead Model: 4136
Implantable Lead Serial Number: 29410465
Implantable Lead Serial Number: 29414571
Implantable Pulse Generator Implant Date: 20140612
Lead Channel Impedance Value: 621 Ohm
Lead Channel Impedance Value: 686 Ohm
Lead Channel Setting Pacing Amplitude: 2.2 V
Lead Channel Setting Pacing Amplitude: 2.4 V
Lead Channel Setting Pacing Pulse Width: 0.4 ms
Lead Channel Setting Sensing Sensitivity: 2.5 mV
Pulse Gen Serial Number: 112353

## 2019-06-26 MED ORDER — METOPROLOL TARTRATE 100 MG PO TABS: 100.0000 mg | ORAL_TABLET | Freq: Two times a day (BID) | ORAL | 11 refills | Status: DC

## 2019-06-26 NOTE — Telephone Encounter (Signed)
Pt's medication was sent to pt's pharmacy as requested. Confirmation received.  °

## 2019-07-02 ENCOUNTER — Encounter: Payer: Self-pay | Admitting: Cardiology

## 2019-07-02 NOTE — Progress Notes (Signed)
Remote pacemaker transmission.   

## 2019-09-12 DIAGNOSIS — E782 Mixed hyperlipidemia: Secondary | ICD-10-CM | POA: Diagnosis not present

## 2019-09-12 DIAGNOSIS — Z139 Encounter for screening, unspecified: Secondary | ICD-10-CM | POA: Diagnosis not present

## 2019-09-12 DIAGNOSIS — I4891 Unspecified atrial fibrillation: Secondary | ICD-10-CM | POA: Diagnosis not present

## 2019-09-12 DIAGNOSIS — I10 Essential (primary) hypertension: Secondary | ICD-10-CM | POA: Diagnosis not present

## 2019-09-25 ENCOUNTER — Ambulatory Visit (INDEPENDENT_AMBULATORY_CARE_PROVIDER_SITE_OTHER): Payer: Medicare Other | Admitting: *Deleted

## 2019-09-25 DIAGNOSIS — I495 Sick sinus syndrome: Secondary | ICD-10-CM

## 2019-09-25 LAB — CUP PACEART REMOTE DEVICE CHECK
Battery Remaining Longevity: 66 mo
Battery Remaining Percentage: 71 %
Brady Statistic RA Percent Paced: 98 %
Brady Statistic RV Percent Paced: 21 %
Date Time Interrogation Session: 20201230073800
Implantable Lead Implant Date: 20140612
Implantable Lead Implant Date: 20140612
Implantable Lead Location: 753859
Implantable Lead Location: 753860
Implantable Lead Model: 4135
Implantable Lead Model: 4136
Implantable Lead Serial Number: 29410465
Implantable Lead Serial Number: 29414571
Implantable Pulse Generator Implant Date: 20140612
Lead Channel Impedance Value: 656 Ohm
Lead Channel Impedance Value: 721 Ohm
Lead Channel Pacing Threshold Amplitude: 0.7 V
Lead Channel Pacing Threshold Pulse Width: 0.6 ms
Lead Channel Setting Pacing Amplitude: 2.2 V
Lead Channel Setting Pacing Amplitude: 2.4 V
Lead Channel Setting Pacing Pulse Width: 0.4 ms
Lead Channel Setting Sensing Sensitivity: 2.5 mV
Pulse Gen Serial Number: 112353

## 2019-10-07 DIAGNOSIS — G4733 Obstructive sleep apnea (adult) (pediatric): Secondary | ICD-10-CM | POA: Diagnosis not present

## 2019-10-07 LAB — CUP PACEART INCLINIC DEVICE CHECK
Date Time Interrogation Session: 20200929104540
Implantable Lead Implant Date: 20140612
Implantable Lead Implant Date: 20140612
Implantable Lead Location: 753859
Implantable Lead Location: 753860
Implantable Lead Model: 4135
Implantable Lead Model: 4136
Implantable Lead Serial Number: 29410465
Implantable Lead Serial Number: 29414571
Implantable Pulse Generator Implant Date: 20140612
Pulse Gen Serial Number: 112353

## 2019-11-20 DIAGNOSIS — L570 Actinic keratosis: Secondary | ICD-10-CM | POA: Diagnosis not present

## 2019-11-20 DIAGNOSIS — D485 Neoplasm of uncertain behavior of skin: Secondary | ICD-10-CM | POA: Diagnosis not present

## 2019-11-20 DIAGNOSIS — D225 Melanocytic nevi of trunk: Secondary | ICD-10-CM | POA: Diagnosis not present

## 2019-11-20 DIAGNOSIS — L821 Other seborrheic keratosis: Secondary | ICD-10-CM | POA: Diagnosis not present

## 2019-11-20 DIAGNOSIS — C44319 Basal cell carcinoma of skin of other parts of face: Secondary | ICD-10-CM | POA: Diagnosis not present

## 2019-12-15 DIAGNOSIS — C44319 Basal cell carcinoma of skin of other parts of face: Secondary | ICD-10-CM | POA: Diagnosis not present

## 2019-12-15 DIAGNOSIS — Z85828 Personal history of other malignant neoplasm of skin: Secondary | ICD-10-CM | POA: Diagnosis not present

## 2019-12-25 ENCOUNTER — Ambulatory Visit (INDEPENDENT_AMBULATORY_CARE_PROVIDER_SITE_OTHER): Payer: Medicare Other | Admitting: *Deleted

## 2019-12-25 DIAGNOSIS — I495 Sick sinus syndrome: Secondary | ICD-10-CM

## 2019-12-25 LAB — CUP PACEART REMOTE DEVICE CHECK
Battery Remaining Longevity: 66 mo
Battery Remaining Percentage: 67 %
Brady Statistic RA Percent Paced: 98 %
Brady Statistic RV Percent Paced: 19 %
Date Time Interrogation Session: 20210401051600
Implantable Lead Implant Date: 20140612
Implantable Lead Implant Date: 20140612
Implantable Lead Location: 753859
Implantable Lead Location: 753860
Implantable Lead Model: 4135
Implantable Lead Model: 4136
Implantable Lead Serial Number: 29410465
Implantable Lead Serial Number: 29414571
Implantable Pulse Generator Implant Date: 20140612
Lead Channel Impedance Value: 623 Ohm
Lead Channel Impedance Value: 703 Ohm
Lead Channel Pacing Threshold Amplitude: 0.7 V
Lead Channel Pacing Threshold Pulse Width: 0.6 ms
Lead Channel Setting Pacing Amplitude: 2.2 V
Lead Channel Setting Pacing Amplitude: 2.4 V
Lead Channel Setting Pacing Pulse Width: 0.4 ms
Lead Channel Setting Sensing Sensitivity: 2.5 mV
Pulse Gen Serial Number: 112353

## 2019-12-25 NOTE — Progress Notes (Signed)
PPM Remote  

## 2020-01-29 DIAGNOSIS — G4733 Obstructive sleep apnea (adult) (pediatric): Secondary | ICD-10-CM | POA: Diagnosis not present

## 2020-03-12 DIAGNOSIS — R7401 Elevation of levels of liver transaminase levels: Secondary | ICD-10-CM | POA: Diagnosis not present

## 2020-03-12 DIAGNOSIS — E782 Mixed hyperlipidemia: Secondary | ICD-10-CM | POA: Diagnosis not present

## 2020-03-12 DIAGNOSIS — I4891 Unspecified atrial fibrillation: Secondary | ICD-10-CM | POA: Diagnosis not present

## 2020-03-12 DIAGNOSIS — Z79899 Other long term (current) drug therapy: Secondary | ICD-10-CM | POA: Diagnosis not present

## 2020-03-12 DIAGNOSIS — I10 Essential (primary) hypertension: Secondary | ICD-10-CM | POA: Diagnosis not present

## 2020-03-15 DIAGNOSIS — Z1211 Encounter for screening for malignant neoplasm of colon: Secondary | ICD-10-CM | POA: Diagnosis not present

## 2020-03-15 DIAGNOSIS — Z125 Encounter for screening for malignant neoplasm of prostate: Secondary | ICD-10-CM | POA: Diagnosis not present

## 2020-03-15 DIAGNOSIS — Z0000 Encounter for general adult medical examination without abnormal findings: Secondary | ICD-10-CM | POA: Diagnosis not present

## 2020-03-15 DIAGNOSIS — Z1331 Encounter for screening for depression: Secondary | ICD-10-CM | POA: Diagnosis not present

## 2020-03-25 ENCOUNTER — Ambulatory Visit (INDEPENDENT_AMBULATORY_CARE_PROVIDER_SITE_OTHER): Payer: Medicare Other | Admitting: *Deleted

## 2020-03-25 DIAGNOSIS — I495 Sick sinus syndrome: Secondary | ICD-10-CM

## 2020-03-25 LAB — CUP PACEART REMOTE DEVICE CHECK
Battery Remaining Longevity: 60 mo
Battery Remaining Percentage: 66 %
Brady Statistic RA Percent Paced: 98 %
Brady Statistic RV Percent Paced: 21 %
Date Time Interrogation Session: 20210701050100
Implantable Lead Implant Date: 20140612
Implantable Lead Implant Date: 20140612
Implantable Lead Location: 753859
Implantable Lead Location: 753860
Implantable Lead Model: 4135
Implantable Lead Model: 4136
Implantable Lead Serial Number: 29410465
Implantable Lead Serial Number: 29414571
Implantable Pulse Generator Implant Date: 20140612
Lead Channel Impedance Value: 627 Ohm
Lead Channel Impedance Value: 708 Ohm
Lead Channel Pacing Threshold Amplitude: 0.7 V
Lead Channel Pacing Threshold Pulse Width: 0.6 ms
Lead Channel Setting Pacing Amplitude: 2.2 V
Lead Channel Setting Pacing Amplitude: 2.4 V
Lead Channel Setting Pacing Pulse Width: 0.4 ms
Lead Channel Setting Sensing Sensitivity: 2.5 mV
Pulse Gen Serial Number: 112353

## 2020-03-26 NOTE — Progress Notes (Signed)
Remote pacemaker transmission.   

## 2020-04-02 DIAGNOSIS — H40033 Anatomical narrow angle, bilateral: Secondary | ICD-10-CM | POA: Diagnosis not present

## 2020-04-02 DIAGNOSIS — H2513 Age-related nuclear cataract, bilateral: Secondary | ICD-10-CM | POA: Diagnosis not present

## 2020-04-16 DIAGNOSIS — H2513 Age-related nuclear cataract, bilateral: Secondary | ICD-10-CM | POA: Diagnosis not present

## 2020-04-16 DIAGNOSIS — H40033 Anatomical narrow angle, bilateral: Secondary | ICD-10-CM | POA: Diagnosis not present

## 2020-04-16 DIAGNOSIS — H43821 Vitreomacular adhesion, right eye: Secondary | ICD-10-CM | POA: Diagnosis not present

## 2020-05-12 DIAGNOSIS — Z6824 Body mass index (BMI) 24.0-24.9, adult: Secondary | ICD-10-CM | POA: Diagnosis not present

## 2020-05-12 DIAGNOSIS — H269 Unspecified cataract: Secondary | ICD-10-CM | POA: Diagnosis not present

## 2020-05-12 DIAGNOSIS — Z01818 Encounter for other preprocedural examination: Secondary | ICD-10-CM | POA: Diagnosis not present

## 2020-05-13 DIAGNOSIS — I2510 Atherosclerotic heart disease of native coronary artery without angina pectoris: Secondary | ICD-10-CM | POA: Diagnosis not present

## 2020-05-13 DIAGNOSIS — Z79899 Other long term (current) drug therapy: Secondary | ICD-10-CM | POA: Diagnosis not present

## 2020-05-13 DIAGNOSIS — I10 Essential (primary) hypertension: Secondary | ICD-10-CM | POA: Diagnosis not present

## 2020-05-13 DIAGNOSIS — H2511 Age-related nuclear cataract, right eye: Secondary | ICD-10-CM | POA: Diagnosis not present

## 2020-05-13 DIAGNOSIS — Z950 Presence of cardiac pacemaker: Secondary | ICD-10-CM | POA: Diagnosis not present

## 2020-05-13 DIAGNOSIS — H2589 Other age-related cataract: Secondary | ICD-10-CM | POA: Diagnosis not present

## 2020-05-13 DIAGNOSIS — I4891 Unspecified atrial fibrillation: Secondary | ICD-10-CM | POA: Diagnosis not present

## 2020-05-13 DIAGNOSIS — E785 Hyperlipidemia, unspecified: Secondary | ICD-10-CM | POA: Diagnosis not present

## 2020-05-13 DIAGNOSIS — H269 Unspecified cataract: Secondary | ICD-10-CM | POA: Diagnosis not present

## 2020-05-21 DIAGNOSIS — H2512 Age-related nuclear cataract, left eye: Secondary | ICD-10-CM | POA: Diagnosis not present

## 2020-06-14 DIAGNOSIS — Z6824 Body mass index (BMI) 24.0-24.9, adult: Secondary | ICD-10-CM | POA: Diagnosis not present

## 2020-06-14 DIAGNOSIS — Z01818 Encounter for other preprocedural examination: Secondary | ICD-10-CM | POA: Diagnosis not present

## 2020-06-14 DIAGNOSIS — H269 Unspecified cataract: Secondary | ICD-10-CM | POA: Diagnosis not present

## 2020-06-22 ENCOUNTER — Other Ambulatory Visit: Payer: Self-pay | Admitting: Internal Medicine

## 2020-06-22 NOTE — Telephone Encounter (Signed)
Pt last saw Dr Ladona Ridgel 06/24/19, pt has upcoming appt with Dr Ladona Ridgel scheduled for 07/16/20.  Last labs 03/22/20 Creat 0.98 per [facility], age 73, weight 62.1kg, CrCl 58.97, based on CrCl pt is on appropriate dosage of Xarelto 20mg  QD.  Will refill rx.

## 2020-06-24 ENCOUNTER — Ambulatory Visit (INDEPENDENT_AMBULATORY_CARE_PROVIDER_SITE_OTHER): Payer: Medicare Other

## 2020-06-24 DIAGNOSIS — I495 Sick sinus syndrome: Secondary | ICD-10-CM

## 2020-06-26 LAB — CUP PACEART REMOTE DEVICE CHECK
Battery Remaining Longevity: 60 mo
Battery Remaining Percentage: 63 %
Brady Statistic RA Percent Paced: 98 %
Brady Statistic RV Percent Paced: 23 %
Date Time Interrogation Session: 20210930085500
Implantable Lead Implant Date: 20140612
Implantable Lead Implant Date: 20140612
Implantable Lead Location: 753859
Implantable Lead Location: 753860
Implantable Lead Model: 4135
Implantable Lead Model: 4136
Implantable Lead Serial Number: 29410465
Implantable Lead Serial Number: 29414571
Implantable Pulse Generator Implant Date: 20140612
Lead Channel Impedance Value: 637 Ohm
Lead Channel Impedance Value: 716 Ohm
Lead Channel Pacing Threshold Amplitude: 0.7 V
Lead Channel Pacing Threshold Pulse Width: 0.6 ms
Lead Channel Setting Pacing Amplitude: 2.2 V
Lead Channel Setting Pacing Amplitude: 2.4 V
Lead Channel Setting Pacing Pulse Width: 0.4 ms
Lead Channel Setting Sensing Sensitivity: 2.5 mV
Pulse Gen Serial Number: 112353

## 2020-06-28 NOTE — Progress Notes (Signed)
Remote pacemaker transmission.   

## 2020-07-16 ENCOUNTER — Ambulatory Visit: Payer: Medicare Other | Admitting: Internal Medicine

## 2020-07-16 ENCOUNTER — Other Ambulatory Visit: Payer: Self-pay

## 2020-07-16 VITALS — BP 126/76 | HR 70 | Wt 136.2 lb

## 2020-07-16 DIAGNOSIS — I495 Sick sinus syndrome: Secondary | ICD-10-CM | POA: Diagnosis not present

## 2020-07-16 DIAGNOSIS — I10 Essential (primary) hypertension: Secondary | ICD-10-CM

## 2020-07-16 DIAGNOSIS — Z950 Presence of cardiac pacemaker: Secondary | ICD-10-CM

## 2020-07-16 DIAGNOSIS — I480 Paroxysmal atrial fibrillation: Secondary | ICD-10-CM

## 2020-07-16 NOTE — Patient Instructions (Signed)

## 2020-07-16 NOTE — Progress Notes (Signed)
HPI [name] returns today for followup. He is a pleasant 74 yo man with PAF, symptomatic tachy-brady, s/p PPM insertion. He has retired but had taken a part time job which he quit. He denies sob or chest pain. Minimal palpitations. No edema.  No Known Allergies   Current Outpatient Medications  Medication Sig Dispense Refill  . acetaminophen (TYLENOL) 500 MG tablet Take 500 mg by mouth every 6 (six) hours as needed for pain.    . flecainide (TAMBOCOR) 100 MG tablet TAKE ONE TABLET BY MOUTH TWICE DAILY 60 tablet 0  . losartan (COZAAR) 50 MG tablet Take 50 mg by mouth daily.    . metoprolol tartrate (LOPRESSOR) 100 MG tablet TAKE ONE TABLET TWICE DAILY 60 tablet 0  . Multiple Vitamins-Minerals (MULTIVITAMIN WITH MINERALS) tablet Take 1 tablet by mouth daily.    . Omega-3 Fatty Acids (FISH OIL) 1000 MG CAPS Take 1 capsule by mouth daily.    . simvastatin (ZOCOR) 40 MG tablet Take 40 mg by mouth every evening.    Patrick Mccall 20 MG TABS tablet TAKE ONE TABLET BY MOUTH DAILY WITH SUPPER 30 tablet 5   No current facility-administered medications for this visit.     Past Medical History:  Diagnosis Date  . Factor V Leiden deficiency   . HLD (hyperlipidemia)   . Hypertension   . PAF (paroxysmal atrial fibrillation) (HCC)    aflutter ablation 02/2013, pacer implant  . Sleep apnea     ROS:   All systems reviewed and negative except as noted in the HPI.   Past Surgical History:  Procedure Laterality Date  . ATRIAL FLUTTER ABLATION N/A 03/06/2013   Procedure: ATRIAL FLUTTER ABLATION;  Surgeon: Marinus Maw, MD;  Location: [facility] CATH LAB;  Service: Cardiovascular;  Laterality: N/A;  . CARDIAC CATHETERIZATION  02/2013   nl coronaries  . HERNIA REPAIR    . LEFT HEART CATHETERIZATION WITH CORONARY ANGIOGRAM N/A 03/04/2013   Procedure: LEFT HEART CATHETERIZATION WITH CORONARY ANGIOGRAM;  Surgeon: Runell Gess, MD;  Location: [facility] CATH LAB;  Service: Cardiovascular;  Laterality: N/A;    . PACEMAKER INSERTION     BS, pauses and sinus brady  . PERMANENT PACEMAKER INSERTION N/A 03/06/2013   Procedure: PERMANENT PACEMAKER INSERTION;  Surgeon: Marinus Maw, MD;  Location: [facility] CATH LAB;  Service: Cardiovascular;  Laterality: N/A;     Family History  Problem Relation Age of Onset  . Heart attack Mother   . Heart attack Father   . Heart attack Maternal Grandfather   . Heart attack Maternal Grandmother   . Heart attack Paternal Grandfather   . Heart attack Paternal Grandmother      Social History   Socioeconomic History  . Marital status: Married    Spouse name: Not on file  . Number of children: Not on file  . Years of education: Not on file  . Highest education level: Not on file  Occupational History  . Not on file  Tobacco Use  . Smoking status: Never Smoker  . Smokeless tobacco: Never Used  Substance and Sexual Activity  . Alcohol use: Yes    Alcohol/week: 1.0 - 2.0 standard drink    Types: 1 - 2 Standard drinks or equivalent per week  . Drug use: No  . Sexual activity: Not on file  Other Topics Concern  . Not on file  Social History Narrative   Exercises five times per week doing cardio.   Social Determinants  of Health   Financial Resource Strain:   . Difficulty of Paying Living Expenses: Not on file  Food Insecurity:   . Worried About Programme researcher, broadcasting/film/video in the Last Year: Not on file  . Ran Out of Food in the Last Year: Not on file  Transportation Needs:   . Lack of Transportation (Medical): Not on file  . Lack of Transportation (Non-Medical): Not on file  Physical Activity:   . Days of Exercise per Week: Not on file  . Minutes of Exercise per Session: Not on file  Stress:   . Feeling of Stress : Not on file  Social Connections:   . Frequency of Communication with Friends and Family: Not on file  . Frequency of Social Gatherings with Friends and Family: Not on file  . Attends Religious Services: Not on file  . Active Member of Clubs or  Organizations: Not on file  . Attends Banker Meetings: Not on file  . Marital Status: Not on file  Intimate Partner Violence:   . Fear of Current or Ex-Partner: Not on file  . Emotionally Abused: Not on file  . Physically Abused: Not on file  . Sexually Abused: Not on file     BP 126/76   Pulse 70   Wt 136 lb 3.2 oz (61.8 kg)   SpO2 96%   BMI 24.13 kg/m   Physical Exam:  Well appearing NAD HEENT: Unremarkable Neck:  No JVD, no thyromegally Lymphatics:  No adenopathy Back:  No CVA tenderness Lungs:  Clear with no wheezes HEART:  Regular rate rhythm, no murmurs, no rubs, no clicks Abd:  soft, positive bowel sounds, no organomegally, no rebound, no guarding Ext:  2 plus pulses, no edema, no cyanosis, no clubbing Skin:  No rashes no nodules Neuro:  CN II through XII intact, motor grossly intact  EKG - nsr with atrial pacing  DEVICE  Normal device function.  See PaceArt for details.   Assess/Plan: 1. Sinus node dysfunction - he is asymptomatic, s/p PPM insertion. 2. PPM - his [employer] DDD PM is working normally. We will recheck in several months. 3. HTN - his bp is well controlled.  4. PAF - he is maintaining NSR.He will continue flecainide.  Patrick Gowda Vittoria Noreen,MD

## 2020-07-27 ENCOUNTER — Other Ambulatory Visit: Payer: Self-pay | Admitting: Internal Medicine

## 2020-09-23 ENCOUNTER — Ambulatory Visit (INDEPENDENT_AMBULATORY_CARE_PROVIDER_SITE_OTHER): Payer: Medicare Other

## 2020-09-23 DIAGNOSIS — I495 Sick sinus syndrome: Secondary | ICD-10-CM

## 2020-09-23 LAB — CUP PACEART REMOTE DEVICE CHECK
Battery Remaining Longevity: 54 mo
Battery Remaining Percentage: 56 %
Brady Statistic RA Percent Paced: 99 %
Brady Statistic RV Percent Paced: 18 %
Date Time Interrogation Session: 20211230050100
Implantable Lead Implant Date: 20140612
Implantable Lead Implant Date: 20140612
Implantable Lead Location: 753859
Implantable Lead Location: 753860
Implantable Lead Model: 4135
Implantable Lead Model: 4136
Implantable Lead Serial Number: 29410465
Implantable Lead Serial Number: 29414571
Implantable Pulse Generator Implant Date: 20140612
Lead Channel Impedance Value: 576 Ohm
Lead Channel Impedance Value: 702 Ohm
Lead Channel Pacing Threshold Amplitude: 0.7 V
Lead Channel Pacing Threshold Amplitude: 2.6 V
Lead Channel Pacing Threshold Pulse Width: 0.4 ms
Lead Channel Pacing Threshold Pulse Width: 0.6 ms
Lead Channel Setting Pacing Amplitude: 2.2 V
Lead Channel Setting Pacing Amplitude: 2.9 V
Lead Channel Setting Pacing Pulse Width: 0.4 ms
Lead Channel Setting Sensing Sensitivity: 2.5 mV
Pulse Gen Serial Number: 112353

## 2020-10-07 NOTE — Progress Notes (Signed)
Remote pacemaker transmission.   

## 2020-12-16 ENCOUNTER — Other Ambulatory Visit: Payer: Self-pay | Admitting: Internal Medicine

## 2020-12-16 NOTE — Telephone Encounter (Signed)
Prescription refill request for Xarelto received.   Indication: aflutter Last office visit: Ladona Ridgel 07/16/2020 Weight: 61.8 kg  Age: 75 yo  Scr: 1.01, 09/13/2020 via [facility]  CrCl: 56 ml/min   Pt is on the correct dose of Xarelto per dosing criteria, prescription refill sent for Xarelto 20mg  daily.

## 2020-12-23 ENCOUNTER — Ambulatory Visit (INDEPENDENT_AMBULATORY_CARE_PROVIDER_SITE_OTHER): Payer: Medicare Other

## 2020-12-23 DIAGNOSIS — I495 Sick sinus syndrome: Secondary | ICD-10-CM

## 2020-12-23 LAB — CUP PACEART REMOTE DEVICE CHECK
Battery Remaining Longevity: 48 mo
Battery Remaining Percentage: 53 %
Brady Statistic RA Percent Paced: 99 %
Brady Statistic RV Percent Paced: 18 %
Date Time Interrogation Session: 20220331050000
Implantable Lead Implant Date: 20140612
Implantable Lead Implant Date: 20140612
Implantable Lead Location: 753859
Implantable Lead Location: 753860
Implantable Lead Model: 4135
Implantable Lead Model: 4136
Implantable Lead Serial Number: 29410465
Implantable Lead Serial Number: 29414571
Implantable Pulse Generator Implant Date: 20140612
Lead Channel Impedance Value: 624 Ohm
Lead Channel Impedance Value: 722 Ohm
Lead Channel Pacing Threshold Amplitude: 0.7 V
Lead Channel Pacing Threshold Amplitude: 1.6 V
Lead Channel Pacing Threshold Pulse Width: 0.4 ms
Lead Channel Pacing Threshold Pulse Width: 0.6 ms
Lead Channel Setting Pacing Amplitude: 2.1 V
Lead Channel Setting Pacing Amplitude: 2.2 V
Lead Channel Setting Pacing Pulse Width: 0.4 ms
Lead Channel Setting Sensing Sensitivity: 2.5 mV
Pulse Gen Serial Number: 112353

## 2021-01-04 NOTE — Progress Notes (Signed)
Remote pacemaker transmission.   

## 2021-01-17 ENCOUNTER — Telehealth: Payer: Self-pay | Admitting: Internal Medicine

## 2021-01-17 NOTE — Telephone Encounter (Signed)
Pt will be out of town on date of next scheduled remote transmission.  Transmission rescheduled for 7/7, future schedule updated as well.  Date updated in [employer].

## 2021-01-17 NOTE — Telephone Encounter (Signed)
New Message::    Pt would like to change his 03-24-21 appt please.

## 2021-03-14 DIAGNOSIS — I495 Sick sinus syndrome: Secondary | ICD-10-CM | POA: Diagnosis not present

## 2021-03-14 DIAGNOSIS — Z79899 Other long term (current) drug therapy: Secondary | ICD-10-CM | POA: Diagnosis not present

## 2021-03-14 DIAGNOSIS — I10 Essential (primary) hypertension: Secondary | ICD-10-CM | POA: Diagnosis not present

## 2021-03-14 DIAGNOSIS — E782 Mixed hyperlipidemia: Secondary | ICD-10-CM | POA: Diagnosis not present

## 2021-03-14 DIAGNOSIS — I4891 Unspecified atrial fibrillation: Secondary | ICD-10-CM | POA: Diagnosis not present

## 2021-03-15 DIAGNOSIS — R053 Chronic cough: Secondary | ICD-10-CM | POA: Diagnosis not present

## 2021-03-15 DIAGNOSIS — R059 Cough, unspecified: Secondary | ICD-10-CM | POA: Diagnosis not present

## 2021-03-25 DIAGNOSIS — G4733 Obstructive sleep apnea (adult) (pediatric): Secondary | ICD-10-CM | POA: Diagnosis not present

## 2021-03-31 ENCOUNTER — Ambulatory Visit (INDEPENDENT_AMBULATORY_CARE_PROVIDER_SITE_OTHER): Payer: Medicare Other

## 2021-03-31 DIAGNOSIS — I495 Sick sinus syndrome: Secondary | ICD-10-CM

## 2021-03-31 LAB — CUP PACEART REMOTE DEVICE CHECK
Battery Remaining Longevity: 42 mo
Battery Remaining Percentage: 48 %
Brady Statistic RA Percent Paced: 99 %
Brady Statistic RV Percent Paced: 19 %
Date Time Interrogation Session: 20220707102600
Implantable Lead Implant Date: 20140612
Implantable Lead Implant Date: 20140612
Implantable Lead Location: 753859
Implantable Lead Location: 753860
Implantable Lead Model: 4135
Implantable Lead Model: 4136
Implantable Lead Serial Number: 29410465
Implantable Lead Serial Number: 29414571
Implantable Pulse Generator Implant Date: 20140612
Lead Channel Impedance Value: 632 Ohm
Lead Channel Impedance Value: 724 Ohm
Lead Channel Pacing Threshold Amplitude: 0.7 V
Lead Channel Pacing Threshold Amplitude: 1.5 V
Lead Channel Pacing Threshold Pulse Width: 0.4 ms
Lead Channel Pacing Threshold Pulse Width: 0.6 ms
Lead Channel Setting Pacing Amplitude: 2 V
Lead Channel Setting Pacing Amplitude: 2.2 V
Lead Channel Setting Pacing Pulse Width: 0.4 ms
Lead Channel Setting Sensing Sensitivity: 2.5 mV
Pulse Gen Serial Number: 112353

## 2021-04-01 DIAGNOSIS — E875 Hyperkalemia: Secondary | ICD-10-CM | POA: Diagnosis not present

## 2021-04-21 NOTE — Progress Notes (Signed)
Remote pacemaker transmission.   

## 2021-05-02 ENCOUNTER — Telehealth: Payer: Self-pay | Admitting: Internal Medicine

## 2021-05-02 NOTE — Telephone Encounter (Signed)
I spoke with the patient and rescheduled his remote appointment.

## 2021-05-02 NOTE — Telephone Encounter (Signed)
  1. Has your device fired? no  2. Is you device beeping? no  3. Are you experiencing draining or swelling at device site? no  4. Are you calling to see if we received your device transmission? no  5. Have you passed out? no  Patient states he will be out of town the week of 10/6 and the week after. He would like to reschedule his home remote pacercheck.  Please route to Device Clinic Pool

## 2021-06-15 ENCOUNTER — Other Ambulatory Visit: Payer: Self-pay | Admitting: Internal Medicine

## 2021-06-15 NOTE — Telephone Encounter (Signed)
Xarelto 20mg  refill request received. Pt is 75 years old, weight-61.8kg, Crea-1.01 on 09/13/2020 via [facility] from [place], last seen by Dr. Bethesda on 07/16/2020, Diagnosis-Afib, CrCl-56.12ml/min; Dose is appropriate based on dosing criteria. Will send in refill to requested pharmacy.

## 2021-06-22 DIAGNOSIS — Z23 Encounter for immunization: Secondary | ICD-10-CM | POA: Diagnosis not present

## 2021-07-14 ENCOUNTER — Ambulatory Visit (INDEPENDENT_AMBULATORY_CARE_PROVIDER_SITE_OTHER): Payer: Medicare Other

## 2021-07-14 DIAGNOSIS — I495 Sick sinus syndrome: Secondary | ICD-10-CM

## 2021-07-14 LAB — CUP PACEART REMOTE DEVICE CHECK
Battery Remaining Longevity: 42 mo
Battery Remaining Percentage: 46 %
Brady Statistic RA Percent Paced: 99 %
Brady Statistic RV Percent Paced: 19 %
Date Time Interrogation Session: 20221020050100
Implantable Lead Implant Date: 20140612
Implantable Lead Implant Date: 20140612
Implantable Lead Location: 753859
Implantable Lead Location: 753860
Implantable Lead Model: 4135
Implantable Lead Model: 4136
Implantable Lead Serial Number: 29410465
Implantable Lead Serial Number: 29414571
Implantable Pulse Generator Implant Date: 20140612
Lead Channel Impedance Value: 617 Ohm
Lead Channel Impedance Value: 721 Ohm
Lead Channel Pacing Threshold Amplitude: 0.7 V
Lead Channel Pacing Threshold Amplitude: 1.8 V
Lead Channel Pacing Threshold Pulse Width: 0.4 ms
Lead Channel Pacing Threshold Pulse Width: 0.6 ms
Lead Channel Setting Pacing Amplitude: 2.2 V
Lead Channel Setting Pacing Amplitude: 2.2 V
Lead Channel Setting Pacing Pulse Width: 0.4 ms
Lead Channel Setting Sensing Sensitivity: 2.5 mV
Pulse Gen Serial Number: 112353

## 2021-07-15 ENCOUNTER — Other Ambulatory Visit: Payer: Self-pay | Admitting: Internal Medicine

## 2021-07-21 NOTE — Progress Notes (Signed)
Remote pacemaker transmission.   

## 2021-08-03 ENCOUNTER — Other Ambulatory Visit: Payer: Self-pay

## 2021-08-03 ENCOUNTER — Ambulatory Visit: Payer: Medicare Other | Admitting: Internal Medicine

## 2021-08-03 VITALS — BP 162/86 | HR 70 | Ht 63.0 in | Wt 138.6 lb

## 2021-08-03 DIAGNOSIS — Z950 Presence of cardiac pacemaker: Secondary | ICD-10-CM

## 2021-08-03 DIAGNOSIS — I495 Sick sinus syndrome: Secondary | ICD-10-CM

## 2021-08-03 DIAGNOSIS — I10 Essential (primary) hypertension: Secondary | ICD-10-CM

## 2021-08-03 DIAGNOSIS — I480 Paroxysmal atrial fibrillation: Secondary | ICD-10-CM | POA: Diagnosis not present

## 2021-08-03 NOTE — Progress Notes (Signed)
HPI Mr. Konopka returns today for followup. He is a pleasant 75 yo man with PAF, symptomatic tachy-brady, s/p PPM insertion. He has retired but had taken a part time job which he quit. He denies sob or chest pain. Minimal palpitations. No edema. No Known Allergies   Current Outpatient Medications  Medication Sig Dispense Refill   acetaminophen (TYLENOL) 500 MG tablet Take 500 mg by mouth every 6 (six) hours as needed for pain.     flecainide (TAMBOCOR) 100 MG tablet TAKE ONE TABLET BY MOUTH TWICE DAILY 180 tablet 3   losartan (COZAAR) 50 MG tablet Take 50 mg by mouth daily.     metoprolol tartrate (LOPRESSOR) 100 MG tablet TAKE ONE TABLET TWICE DAILY 180 tablet 3   Multiple Vitamins-Minerals (MULTIVITAMIN WITH MINERALS) tablet Take 1 tablet by mouth daily.     Omega-3 Fatty Acids (FISH OIL) 1000 MG CAPS Take 1 capsule by mouth daily.     simvastatin (ZOCOR) 40 MG tablet Take 40 mg by mouth every evening.     XARELTO 20 MG TABS tablet TAKE ONE TABLET BY MOUTH DAILY WITH SUPPER 30 tablet 5   No current facility-administered medications for this visit.     Past Medical History:  Diagnosis Date   Factor V Leiden deficiency    HLD (hyperlipidemia)    Hypertension    PAF (paroxysmal atrial fibrillation) (HCC)    aflutter ablation 02/2013, pacer implant   Sleep apnea     ROS:   All systems reviewed and negative except as noted in the HPI.   Past Surgical History:  Procedure Laterality Date   ATRIAL FLUTTER ABLATION N/A 03/06/2013   Procedure: ATRIAL FLUTTER ABLATION;  Surgeon: Marinus Maw, MD;  Location: [facility] CATH LAB;  Service: Cardiovascular;  Laterality: N/A;   CARDIAC CATHETERIZATION  02/2013   nl coronaries   HERNIA REPAIR     LEFT HEART CATHETERIZATION WITH CORONARY ANGIOGRAM N/A 03/04/2013   Procedure: LEFT HEART CATHETERIZATION WITH CORONARY ANGIOGRAM;  Surgeon: Runell Gess, MD;  Location: [facility] CATH LAB;  Service: Cardiovascular;  Laterality: N/A;   PACEMAKER  INSERTION     BS, pauses and sinus brady   PERMANENT PACEMAKER INSERTION N/A 03/06/2013   Procedure: PERMANENT PACEMAKER INSERTION;  Surgeon: Marinus Maw, MD;  Location: [facility] CATH LAB;  Service: Cardiovascular;  Laterality: N/A;     Family History  Problem Relation Age of Onset   Heart attack Mother    Heart attack Father    Heart attack Maternal Grandfather    Heart attack Maternal Grandmother    Heart attack Paternal Grandfather    Heart attack Paternal Grandmother      Social History   Socioeconomic History   Marital status: Married    Spouse name: Not on file   Number of children: Not on file   Years of education: Not on file   Highest education level: Not on file  Occupational History   Not on file  Tobacco Use   Smoking status: Never   Smokeless tobacco: Never  Substance and Sexual Activity   Alcohol use: Yes    Alcohol/week: 1.0 - 2.0 standard drink    Types: 1 - 2 Standard drinks or equivalent per week   Drug use: No   Sexual activity: Not on file  Other Topics Concern   Not on file  Social History Narrative   Exercises five times per week doing cardio.   Social Determinants of Health  Financial Resource Strain: Not on file  Food Insecurity: Not on file  Transportation Needs: Not on file  Physical Activity: Not on file  Stress: Not on file  Social Connections: Not on file  Intimate Partner Violence: Not on file     BP (!) 162/86   Pulse 70   Ht 5\' 3"  (1.6 m)   Wt 138 lb 9.6 oz (62.9 kg)   SpO2 97%   BMI 24.55 kg/m   Physical Exam:  Well appearing NAD HEENT: Unremarkable Neck:  No JVD, no thyromegally Lymphatics:  No adenopathy Back:  No CVA tenderness Lungs:  Clear with no wheezes HEART:  Regular rate rhythm, no murmurs, no rubs, no clicks Abd:  soft, positive bowel sounds, no organomegally, no rebound, no guarding Ext:  2 plus pulses, no edema, no cyanosis, no clubbing Skin:  No rashes no nodules Neuro:  CN II through XII intact,  motor grossly intact  EKG  - nsr with atrial pacing  DEVICE  Normal device function.  See PaceArt for details.   Assess/Plan:  Sinus node dysfunction - he is asymptomatic s/p [employer] PPM insertion.  PAF - he will continue flecainide 100 mg twice daily HTN - his sbp is up a bit. He is encouraged to continue his current meds and maintain a low sodium diet. Coags - he has had no trouble with xarelto and no bleeding  

## 2021-08-03 NOTE — Patient Instructions (Addendum)
Medication Instructions:  Your physician recommends that you continue on your current medications as directed. Please refer to the Current Medication list given to you today.  Labwork: None ordered.  Testing/Procedures: None ordered.  Follow-Up: Your physician wants you to follow-up in: one year with Lewayne Bunting, MD   Remote monitoring is used to monitor your Pacemaker from home. This monitoring reduces the number of office visits required to check your device to one time per year. It allows [place] to keep an eye on the functioning of your device to ensure it is working properly. You are scheduled for a device check from home on 10/13/2021. You may send your transmission at any time that day. If you have a wireless device, the transmission will be sent automatically. After your physician reviews your transmission, you will receive a postcard with your next transmission date.  Any Other Special Instructions Will Be Listed Below (If Applicable).  If you need a refill on your cardiac medications before your next appointment, please call your pharmacy.

## 2021-08-04 LAB — CUP PACEART INCLINIC DEVICE CHECK
Brady Statistic RA Percent Paced: 99 %
Brady Statistic RV Percent Paced: 19 %
Date Time Interrogation Session: 20221109000000
Implantable Lead Implant Date: 20140612
Implantable Lead Implant Date: 20140612
Implantable Lead Location: 753859
Implantable Lead Location: 753860
Implantable Lead Model: 4135
Implantable Lead Model: 4136
Implantable Lead Serial Number: 29410465
Implantable Lead Serial Number: 29414571
Implantable Pulse Generator Implant Date: 20140612
Lead Channel Impedance Value: 608 Ohm
Lead Channel Impedance Value: 708 Ohm
Lead Channel Pacing Threshold Amplitude: 0.8 V
Lead Channel Pacing Threshold Amplitude: 1.5 V
Lead Channel Pacing Threshold Pulse Width: 0.4 ms
Lead Channel Pacing Threshold Pulse Width: 0.6 ms
Lead Channel Sensing Intrinsic Amplitude: 13 mV
Lead Channel Sensing Intrinsic Amplitude: 3.2 mV
Lead Channel Setting Pacing Amplitude: 2.2 V
Lead Channel Setting Pacing Amplitude: 3.5 V
Lead Channel Setting Pacing Pulse Width: 0.4 ms
Lead Channel Setting Sensing Sensitivity: 2.5 mV
Pulse Gen Serial Number: 112353

## 2021-09-13 DIAGNOSIS — Z79899 Other long term (current) drug therapy: Secondary | ICD-10-CM | POA: Diagnosis not present

## 2021-09-13 DIAGNOSIS — I10 Essential (primary) hypertension: Secondary | ICD-10-CM | POA: Diagnosis not present

## 2021-09-13 DIAGNOSIS — Z125 Encounter for screening for malignant neoplasm of prostate: Secondary | ICD-10-CM | POA: Diagnosis not present

## 2021-09-13 DIAGNOSIS — I4891 Unspecified atrial fibrillation: Secondary | ICD-10-CM | POA: Diagnosis not present

## 2021-09-13 DIAGNOSIS — E782 Mixed hyperlipidemia: Secondary | ICD-10-CM | POA: Diagnosis not present

## 2021-10-06 DIAGNOSIS — Z0000 Encounter for general adult medical examination without abnormal findings: Secondary | ICD-10-CM | POA: Diagnosis not present

## 2021-10-06 DIAGNOSIS — E785 Hyperlipidemia, unspecified: Secondary | ICD-10-CM | POA: Diagnosis not present

## 2021-10-06 DIAGNOSIS — Z1331 Encounter for screening for depression: Secondary | ICD-10-CM | POA: Diagnosis not present

## 2021-10-13 ENCOUNTER — Ambulatory Visit (INDEPENDENT_AMBULATORY_CARE_PROVIDER_SITE_OTHER): Payer: Medicare Other

## 2021-10-13 DIAGNOSIS — I495 Sick sinus syndrome: Secondary | ICD-10-CM | POA: Diagnosis not present

## 2021-10-13 LAB — CUP PACEART REMOTE DEVICE CHECK
Battery Remaining Longevity: 42 mo
Battery Remaining Percentage: 43 %
Brady Statistic RA Percent Paced: 99 %
Brady Statistic RV Percent Paced: 21 %
Date Time Interrogation Session: 20230119050100
Implantable Lead Implant Date: 20140612
Implantable Lead Implant Date: 20140612
Implantable Lead Location: 753859
Implantable Lead Location: 753860
Implantable Lead Model: 4135
Implantable Lead Model: 4136
Implantable Lead Serial Number: 29410465
Implantable Lead Serial Number: 29414571
Implantable Pulse Generator Implant Date: 20140612
Lead Channel Impedance Value: 631 Ohm
Lead Channel Impedance Value: 698 Ohm
Lead Channel Pacing Threshold Amplitude: 0.8 V
Lead Channel Pacing Threshold Amplitude: 1.4 V
Lead Channel Pacing Threshold Pulse Width: 0.4 ms
Lead Channel Pacing Threshold Pulse Width: 0.6 ms
Lead Channel Setting Pacing Amplitude: 1.9 V
Lead Channel Setting Pacing Amplitude: 2.2 V
Lead Channel Setting Pacing Pulse Width: 0.4 ms
Lead Channel Setting Sensing Sensitivity: 2.5 mV
Pulse Gen Serial Number: 112353

## 2021-10-24 DIAGNOSIS — G4733 Obstructive sleep apnea (adult) (pediatric): Secondary | ICD-10-CM | POA: Diagnosis not present

## 2021-10-25 NOTE — Progress Notes (Signed)
Remote pacemaker transmission.   

## 2021-11-01 DIAGNOSIS — Z9989 Dependence on other enabling machines and devices: Secondary | ICD-10-CM | POA: Diagnosis not present

## 2021-11-01 DIAGNOSIS — Z6824 Body mass index (BMI) 24.0-24.9, adult: Secondary | ICD-10-CM | POA: Diagnosis not present

## 2021-11-01 DIAGNOSIS — G4733 Obstructive sleep apnea (adult) (pediatric): Secondary | ICD-10-CM | POA: Diagnosis not present

## 2021-12-03 DIAGNOSIS — K122 Cellulitis and abscess of mouth: Secondary | ICD-10-CM | POA: Diagnosis not present

## 2021-12-17 ENCOUNTER — Other Ambulatory Visit: Payer: Self-pay | Admitting: Internal Medicine

## 2021-12-19 NOTE — Telephone Encounter (Signed)
Pt last saw Dr Ladona Ridgel 08/03/21, last labs 09/13/21 Creat 1.03 at [place] per [facility], age 76, weight 62.9kg, CrCl 55.13, based on specified criteria pt is on appropriate dosage of Xarelto 20mg  QD for afib.  Will refill rx.  ?

## 2022-01-02 DIAGNOSIS — R197 Diarrhea, unspecified: Secondary | ICD-10-CM | POA: Diagnosis not present

## 2022-01-03 DIAGNOSIS — G4733 Obstructive sleep apnea (adult) (pediatric): Secondary | ICD-10-CM | POA: Diagnosis not present

## 2022-01-12 ENCOUNTER — Ambulatory Visit (INDEPENDENT_AMBULATORY_CARE_PROVIDER_SITE_OTHER): Payer: Medicare Other

## 2022-01-12 DIAGNOSIS — I495 Sick sinus syndrome: Secondary | ICD-10-CM

## 2022-01-12 LAB — CUP PACEART REMOTE DEVICE CHECK
Battery Remaining Longevity: 36 mo
Battery Remaining Percentage: 40 %
Brady Statistic RA Percent Paced: 98 %
Brady Statistic RV Percent Paced: 16 %
Date Time Interrogation Session: 20230420050100
Implantable Lead Implant Date: 20140612
Implantable Lead Implant Date: 20140612
Implantable Lead Location: 753859
Implantable Lead Location: 753860
Implantable Lead Model: 4135
Implantable Lead Model: 4136
Implantable Lead Serial Number: 29410465
Implantable Lead Serial Number: 29414571
Implantable Pulse Generator Implant Date: 20140612
Lead Channel Impedance Value: 624 Ohm
Lead Channel Impedance Value: 730 Ohm
Lead Channel Pacing Threshold Amplitude: 0.8 V
Lead Channel Pacing Threshold Amplitude: 2 V
Lead Channel Pacing Threshold Pulse Width: 0.4 ms
Lead Channel Pacing Threshold Pulse Width: 0.6 ms
Lead Channel Setting Pacing Amplitude: 2.2 V
Lead Channel Setting Pacing Amplitude: 2.5 V
Lead Channel Setting Pacing Pulse Width: 0.4 ms
Lead Channel Setting Sensing Sensitivity: 2.5 mV
Pulse Gen Serial Number: 112353

## 2022-01-27 NOTE — Progress Notes (Signed)
Remote pacemaker transmission.   

## 2022-02-02 DIAGNOSIS — G4733 Obstructive sleep apnea (adult) (pediatric): Secondary | ICD-10-CM | POA: Diagnosis not present

## 2022-03-05 DIAGNOSIS — G4733 Obstructive sleep apnea (adult) (pediatric): Secondary | ICD-10-CM | POA: Diagnosis not present

## 2022-03-15 DIAGNOSIS — I495 Sick sinus syndrome: Secondary | ICD-10-CM | POA: Diagnosis not present

## 2022-03-15 DIAGNOSIS — I10 Essential (primary) hypertension: Secondary | ICD-10-CM | POA: Diagnosis not present

## 2022-03-15 DIAGNOSIS — I4891 Unspecified atrial fibrillation: Secondary | ICD-10-CM | POA: Diagnosis not present

## 2022-03-15 DIAGNOSIS — Z79899 Other long term (current) drug therapy: Secondary | ICD-10-CM | POA: Diagnosis not present

## 2022-03-15 DIAGNOSIS — E782 Mixed hyperlipidemia: Secondary | ICD-10-CM | POA: Diagnosis not present

## 2022-03-23 DIAGNOSIS — E875 Hyperkalemia: Secondary | ICD-10-CM | POA: Diagnosis not present

## 2022-04-13 ENCOUNTER — Ambulatory Visit (INDEPENDENT_AMBULATORY_CARE_PROVIDER_SITE_OTHER): Payer: Medicare Other

## 2022-04-13 DIAGNOSIS — I495 Sick sinus syndrome: Secondary | ICD-10-CM | POA: Diagnosis not present

## 2022-04-14 LAB — CUP PACEART REMOTE DEVICE CHECK
Battery Remaining Longevity: 30 mo
Battery Remaining Percentage: 35 %
Brady Statistic RA Percent Paced: 98 %
Brady Statistic RV Percent Paced: 20 %
Date Time Interrogation Session: 20230721093900
Implantable Lead Implant Date: 20140612
Implantable Lead Implant Date: 20140612
Implantable Lead Location: 753859
Implantable Lead Location: 753860
Implantable Lead Model: 4135
Implantable Lead Model: 4136
Implantable Lead Serial Number: 29410465
Implantable Lead Serial Number: 29414571
Implantable Pulse Generator Implant Date: 20140612
Lead Channel Impedance Value: 602 Ohm
Lead Channel Impedance Value: 729 Ohm
Lead Channel Pacing Threshold Amplitude: 0.8 V
Lead Channel Pacing Threshold Amplitude: 1.8 V
Lead Channel Pacing Threshold Pulse Width: 0.4 ms
Lead Channel Pacing Threshold Pulse Width: 0.6 ms
Lead Channel Setting Pacing Amplitude: 2.2 V
Lead Channel Setting Pacing Amplitude: 3.5 V
Lead Channel Setting Pacing Pulse Width: 0.4 ms
Lead Channel Setting Sensing Sensitivity: 2.5 mV
Pulse Gen Serial Number: 112353

## 2022-05-01 NOTE — Progress Notes (Signed)
Remote pacemaker transmission.   

## 2022-05-05 DIAGNOSIS — G4733 Obstructive sleep apnea (adult) (pediatric): Secondary | ICD-10-CM | POA: Diagnosis not present

## 2022-05-30 DIAGNOSIS — G4733 Obstructive sleep apnea (adult) (pediatric): Secondary | ICD-10-CM | POA: Diagnosis not present

## 2022-06-15 ENCOUNTER — Other Ambulatory Visit: Payer: Self-pay | Admitting: Internal Medicine

## 2022-06-15 DIAGNOSIS — I480 Paroxysmal atrial fibrillation: Secondary | ICD-10-CM

## 2022-06-15 NOTE — Telephone Encounter (Signed)
Prescription refill request for Xarelto received.  Indication: Afib  Last office visit: 08/03/21 Lovena Le) Weight: 62.9kg Age: 76 Scr: 0.95 (03/15/22 via [facility])  CrCl: 59.20ml/min  Appropriate dose and refill sent to requested pharmacy.

## 2022-07-13 ENCOUNTER — Ambulatory Visit (INDEPENDENT_AMBULATORY_CARE_PROVIDER_SITE_OTHER): Payer: Medicare Other

## 2022-07-13 DIAGNOSIS — I495 Sick sinus syndrome: Secondary | ICD-10-CM | POA: Diagnosis not present

## 2022-07-14 DIAGNOSIS — Z23 Encounter for immunization: Secondary | ICD-10-CM | POA: Diagnosis not present

## 2022-07-15 ENCOUNTER — Other Ambulatory Visit: Payer: Self-pay | Admitting: Internal Medicine

## 2022-07-17 LAB — CUP PACEART REMOTE DEVICE CHECK
Battery Remaining Longevity: 30 mo
Battery Remaining Percentage: 33 %
Brady Statistic RA Percent Paced: 99 %
Brady Statistic RV Percent Paced: 24 %
Date Time Interrogation Session: 20231020140200
Implantable Lead Implant Date: 20140612
Implantable Lead Implant Date: 20140612
Implantable Lead Location: 753859
Implantable Lead Location: 753860
Implantable Lead Model: 4135
Implantable Lead Model: 4136
Implantable Lead Serial Number: 29410465
Implantable Lead Serial Number: 29414571
Implantable Pulse Generator Implant Date: 20140612
Lead Channel Impedance Value: 615 Ohm
Lead Channel Impedance Value: 760 Ohm
Lead Channel Pacing Threshold Amplitude: 0.8 V
Lead Channel Pacing Threshold Amplitude: 1.5 V
Lead Channel Pacing Threshold Pulse Width: 0.4 ms
Lead Channel Pacing Threshold Pulse Width: 0.6 ms
Lead Channel Setting Pacing Amplitude: 2.1 V
Lead Channel Setting Pacing Amplitude: 2.2 V
Lead Channel Setting Pacing Pulse Width: 0.4 ms
Lead Channel Setting Sensing Sensitivity: 2.5 mV
Pulse Gen Serial Number: 112353

## 2022-07-21 NOTE — Progress Notes (Signed)
Remote pacemaker transmission.   

## 2022-08-29 DIAGNOSIS — G4733 Obstructive sleep apnea (adult) (pediatric): Secondary | ICD-10-CM | POA: Diagnosis not present

## 2022-09-08 DIAGNOSIS — G4733 Obstructive sleep apnea (adult) (pediatric): Secondary | ICD-10-CM | POA: Diagnosis not present

## 2022-10-10 DIAGNOSIS — I495 Sick sinus syndrome: Secondary | ICD-10-CM | POA: Diagnosis not present

## 2022-10-10 DIAGNOSIS — I10 Essential (primary) hypertension: Secondary | ICD-10-CM | POA: Diagnosis not present

## 2022-10-10 DIAGNOSIS — Z79899 Other long term (current) drug therapy: Secondary | ICD-10-CM | POA: Diagnosis not present

## 2022-10-10 DIAGNOSIS — I4891 Unspecified atrial fibrillation: Secondary | ICD-10-CM | POA: Diagnosis not present

## 2022-10-10 DIAGNOSIS — E782 Mixed hyperlipidemia: Secondary | ICD-10-CM | POA: Diagnosis not present

## 2022-10-10 DIAGNOSIS — Z125 Encounter for screening for malignant neoplasm of prostate: Secondary | ICD-10-CM | POA: Diagnosis not present

## 2022-10-12 ENCOUNTER — Ambulatory Visit: Payer: Medicare Other | Attending: Internal Medicine

## 2022-10-12 DIAGNOSIS — I495 Sick sinus syndrome: Secondary | ICD-10-CM

## 2022-10-12 LAB — CUP PACEART REMOTE DEVICE CHECK
Battery Remaining Longevity: 30 mo
Battery Remaining Percentage: 30 %
Brady Statistic RA Percent Paced: 99 %
Brady Statistic RV Percent Paced: 27 %
Date Time Interrogation Session: 20240118073700
Implantable Lead Connection Status: 753985
Implantable Lead Connection Status: 753985
Implantable Lead Implant Date: 20140612
Implantable Lead Implant Date: 20140612
Implantable Lead Location: 753859
Implantable Lead Location: 753860
Implantable Lead Model: 4135
Implantable Lead Model: 4136
Implantable Lead Serial Number: 29410465
Implantable Lead Serial Number: 29414571
Implantable Pulse Generator Implant Date: 20140612
Lead Channel Impedance Value: 602 Ohm
Lead Channel Impedance Value: 738 Ohm
Lead Channel Pacing Threshold Amplitude: 0.8 V
Lead Channel Pacing Threshold Amplitude: 1.4 V
Lead Channel Pacing Threshold Pulse Width: 0.4 ms
Lead Channel Pacing Threshold Pulse Width: 0.6 ms
Lead Channel Setting Pacing Amplitude: 1.9 V
Lead Channel Setting Pacing Amplitude: 2.2 V
Lead Channel Setting Pacing Pulse Width: 0.4 ms
Lead Channel Setting Sensing Sensitivity: 2.5 mV
Pulse Gen Serial Number: 112353
Zone Setting Status: 755011

## 2022-10-13 ENCOUNTER — Other Ambulatory Visit: Payer: Self-pay | Admitting: Internal Medicine

## 2022-10-30 NOTE — Progress Notes (Signed)
Remote pacemaker transmission.   

## 2022-11-10 ENCOUNTER — Encounter: Payer: Self-pay | Admitting: Internal Medicine

## 2022-11-10 ENCOUNTER — Ambulatory Visit: Payer: Medicare Other | Attending: Internal Medicine | Admitting: Internal Medicine

## 2022-11-10 VITALS — BP 144/72 | HR 70 | Ht 63.0 in | Wt 136.0 lb

## 2022-11-10 DIAGNOSIS — Z950 Presence of cardiac pacemaker: Secondary | ICD-10-CM

## 2022-11-10 NOTE — Progress Notes (Signed)
HPI [name] returns today for followup. He is a pleasant 77 yo man with PAF, symptomatic tachy-brady, s/p PPM insertion. He denies sob or chest pain. Minimal palpitations. No edema. His [employer] Sci PPM is on recall. He has been asymptomatic. He has 2 years of battery longevity No Known Allergies   Current Outpatient Medications  Medication Sig Dispense Refill   acetaminophen (TYLENOL) 500 MG tablet Take 500 mg by mouth every 6 (six) hours as needed for pain.     flecainide (TAMBOCOR) 100 MG tablet TAKE ONE TABLET BY MOUTH TWICE DAILY 180 tablet 0   losartan (COZAAR) 100 MG tablet Take 100 mg by mouth daily.     metoprolol tartrate (LOPRESSOR) 100 MG tablet TAKE ONE TABLET TWICE DAILY 180 tablet 3   Multiple Vitamins-Minerals (MULTIVITAMIN WITH MINERALS) tablet Take 1 tablet by mouth daily.     Omega-3 Fatty Acids (FISH OIL) 1000 MG CAPS Take 1 capsule by mouth daily.     simvastatin (ZOCOR) 40 MG tablet Take 40 mg by mouth every evening.     XARELTO 20 MG TABS tablet TAKE ONE TABLET BY MOUTH DAILY WITH SUPPER 30 tablet 5   No current facility-administered medications for this visit.     Past Medical History:  Diagnosis Date   Factor V Leiden deficiency    HLD (hyperlipidemia)    Hypertension    PAF (paroxysmal atrial fibrillation) ([place])    aflutter ablation 02/2013, pacer implant   Sleep apnea     ROS:   All systems reviewed and negative except as noted in the HPI.   Past Surgical History:  Procedure Laterality Date   ATRIAL FLUTTER ABLATION N/A 03/06/2013   Procedure: ATRIAL FLUTTER ABLATION;  Surgeon: Evans Lance, MD;  Location: [facility] CATH LAB;  Service: Cardiovascular;  Laterality: N/A;   CARDIAC CATHETERIZATION  02/2013   nl coronaries   HERNIA REPAIR     LEFT HEART CATHETERIZATION WITH CORONARY ANGIOGRAM N/A 03/04/2013   Procedure: LEFT HEART CATHETERIZATION WITH CORONARY ANGIOGRAM;  Surgeon: Lorretta Harp, MD;  Location: [facility] CATH LAB;  Service:  Cardiovascular;  Laterality: N/A;   PACEMAKER INSERTION     BS, pauses and sinus brady   PERMANENT PACEMAKER INSERTION N/A 03/06/2013   Procedure: PERMANENT PACEMAKER INSERTION;  Surgeon: Evans Lance, MD;  Location: [facility] CATH LAB;  Service: Cardiovascular;  Laterality: N/A;     Family History  Problem Relation Age of Onset   Heart attack Mother    Heart attack Father    Heart attack Maternal Grandfather    Heart attack Maternal Grandmother    Heart attack Paternal Grandfather    Heart attack Paternal Grandmother      Social History   Socioeconomic History   Marital status: Married    Spouse name: Not on file   Number of children: Not on file   Years of education: Not on file   Highest education level: Not on file  Occupational History   Not on file  Tobacco Use   Smoking status: Never   Smokeless tobacco: Never  Substance and Sexual Activity   Alcohol use: Yes    Alcohol/week: 1.0 - 2.0 standard drink of alcohol    Types: 1 - 2 Standard drinks or equivalent per week   Drug use: No   Sexual activity: Not on file  Other Topics Concern   Not on file  Social History Narrative   Exercises five times per week doing cardio.  Social Determinants of Health   Financial Resource Strain: Not on file  Food Insecurity: Not on file  Transportation Needs: Not on file  Physical Activity: Not on file  Stress: Not on file  Social Connections: Not on file  Intimate Partner Violence: Not on file     BP (!) 144/72   Pulse 70   Ht 5' 3"$  (1.6 m)   Wt 136 lb (61.7 kg)   SpO2 96%   BMI 24.09 kg/m   Physical Exam:  Well appearing NAD HEENT: Unremarkable Neck:  No JVD, no thyromegally Lymphatics:  No adenopathy Back:  No CVA tenderness Lungs:  Clear HEART:  Regular rate rhythm, no murmurs, no rubs, no clicks Abd:  soft, positive bowel sounds, no organomegally, no rebound, no guarding Ext:  2 plus pulses, no edema, no cyanosis, no clubbing Skin:  No rashes no  nodules Neuro:  CN II through XII intact, motor grossly intact  EKG - nsr with atrial pacing  DEVICE  Normal device function.  See PaceArt for details.   Assess/Plan:  Sinus node dysfunction - he is asymptomatic s/p [employer] Sci PPM insertion. He will undergo PPM gen change as his device has a high likelihood of failure. PAF - he will continue flecainide 100 mg twice daily HTN - his sbp is up a bit. He is encouraged to continue his current meds and maintain a low sodium diet. Coags - he has had no trouble with xarelto and no bleeding   Salome Spotted

## 2022-11-10 NOTE — Patient Instructions (Addendum)
Medication Instructions:  Your physician recommends that you continue on your current medications as directed. Please refer to the Current Medication list given to you today.  *If you need a refill on your cardiac medications before your next appointment, please call your pharmacy*   Lab Work: BMET and CBC on 04/02 at [address] [place], [place], [place] [address] - walk-in anytime between 7:15am and 5:00pm  Testing/Procedures: Generator change on your pacemaker. See instruction letter.    Follow-Up: At [facility], you and your health needs are our priority.  As part of our continuing mission to provide you with exceptional heart care, we have created designated Provider Care Teams.  These Care Teams include your primary Cardiologist (physician) and Advanced Practice Providers (APPs -  Physician Assistants and Nurse Practitioners) who all work together to provide you with the care you need, when you need it.  Your next appointment:   We will call you to schedule.

## 2022-12-07 DIAGNOSIS — G4733 Obstructive sleep apnea (adult) (pediatric): Secondary | ICD-10-CM | POA: Diagnosis not present

## 2022-12-12 ENCOUNTER — Other Ambulatory Visit: Payer: Self-pay | Admitting: Internal Medicine

## 2022-12-12 DIAGNOSIS — I480 Paroxysmal atrial fibrillation: Secondary | ICD-10-CM

## 2022-12-12 NOTE — Telephone Encounter (Signed)
Prescription refill request for Xarelto received.  Indication: afib  Last office visit: 11/10/2022, Lovena Le Weight: 61.7 kg  Age: 77 yo  Scr: 0.89, 10/10/2022 CrCl: 62 ml/min   Refill sent.

## 2022-12-18 ENCOUNTER — Telehealth: Payer: Self-pay | Admitting: *Deleted

## 2022-12-18 DIAGNOSIS — D682 Hereditary deficiency of other clotting factors: Secondary | ICD-10-CM | POA: Diagnosis not present

## 2022-12-18 DIAGNOSIS — K5790 Diverticulosis of intestine, part unspecified, without perforation or abscess without bleeding: Secondary | ICD-10-CM | POA: Diagnosis not present

## 2022-12-18 NOTE — Telephone Encounter (Signed)
Patient is scheduled for a pacemaker generator change on 01/01/2023.  It would not be appropriate to address clearance prior to upcoming procedure. Would recommend that clearance be resent closer to procedure date.  I am removing this request from the preoperative pool.   Please contact our office with questions.   Emmaline Life, NP-C  12/18/2022, 4:47 PM [address], [address] Office [phone] Fax [phone]

## 2022-12-18 NOTE — Telephone Encounter (Signed)
   Pre-operative Risk Assessment    Patient Name: Patrick Mccall  DOB: 12-Jul-1946 MRN: [id number]      Request for Surgical Clearance    Procedure:   COLONOSCOPY  Date of Surgery:  Clearance 03/30/23                                 Surgeon:  DR. Kathlene November MISENHEIMER Surgeon's Group or Practice Name:  [facility] Phone number:   Fax number:  [phone]   Type of Clearance Requested:   - Pharmacy:  Hold Rivaroxaban (Xarelto) X'S 2 DAYS   Type of Anesthesia:  Not Indicated   Additional requests/questions:    Astrid Divine   12/18/2022, 3:37 PM

## 2022-12-26 ENCOUNTER — Ambulatory Visit: Payer: Medicare Other | Attending: Internal Medicine

## 2022-12-26 DIAGNOSIS — Z950 Presence of cardiac pacemaker: Secondary | ICD-10-CM | POA: Diagnosis not present

## 2022-12-27 ENCOUNTER — Telehealth: Payer: Self-pay

## 2022-12-27 LAB — CBC WITH DIFFERENTIAL/PLATELET
Basophils Absolute: 0.1 10*3/uL (ref 0.0–0.2)
Basos: 1 %
EOS (ABSOLUTE): 0.2 10*3/uL (ref 0.0–0.4)
Eos: 3 %
Hematocrit: 45.2 % (ref 37.5–51.0)
Hemoglobin: 14.8 g/dL (ref 13.0–17.7)
Immature Grans (Abs): 0.1 10*3/uL (ref 0.0–0.1)
Immature Granulocytes: 1 %
Lymphocytes Absolute: 2.1 10*3/uL (ref 0.7–3.1)
Lymphs: 29 %
MCH: 31 pg (ref 26.6–33.0)
MCHC: 32.7 g/dL (ref 31.5–35.7)
MCV: 95 fL (ref 79–97)
Monocytes Absolute: 0.8 10*3/uL (ref 0.1–0.9)
Monocytes: 11 %
Neutrophils Absolute: 3.9 10*3/uL (ref 1.4–7.0)
Neutrophils: 55 %
Platelets: 208 10*3/uL (ref 150–450)
RBC: 4.77 x10E6/uL (ref 4.14–5.80)
RDW: 12.8 % (ref 11.6–15.4)
WBC: 7.1 10*3/uL (ref 3.4–10.8)

## 2022-12-27 LAB — BASIC METABOLIC PANEL
BUN/Creatinine Ratio: 32 — ABNORMAL HIGH (ref 10–24)
BUN: 26 mg/dL (ref 8–27)
CO2: 22 mmol/L (ref 20–29)
Calcium: 9.3 mg/dL (ref 8.6–10.2)
Chloride: 104 mmol/L (ref 96–106)
Creatinine, Ser: 0.82 mg/dL (ref 0.76–1.27)
Glucose: 86 mg/dL (ref 70–99)
Potassium: 4.3 mmol/L (ref 3.5–5.2)
Sodium: 140 mmol/L (ref 134–144)
eGFR: 91 mL/min/{1.73_m2} (ref >59)

## 2022-12-27 NOTE — Telephone Encounter (Signed)
Pt contacted and made aware his procedure time on 01/01/2023 was pushed back due to urgent procedure scheduled.  Order to do this was from Dr. Cristopher Peru.   Pt was told to arrive at [facility]  at 130 pm for his now 330 pm generator change.  Pt was originally scheduled for 730 am, and this time was needed for urgent case.    Pt verbalized understanding, that his gen change was now 330 pm, and that he should arrive at 130 pm, and check into admitting.

## 2022-12-29 NOTE — Pre-Procedure Instructions (Signed)
Instructed patient on the following items: Arrival time 1:30 Nothing to eat or drink after midnight No meds AM of procedure Responsible person to drive you home and stay with you for 24 hrs Wash with special soap night before and morning of procedure If on anti-coagulant drug instructions Xarelto- last dose 4/5

## 2023-01-01 ENCOUNTER — Ambulatory Visit (HOSPITAL_COMMUNITY)
Admission: RE | Disposition: A | Discharge status: Home / Self Care | Payer: Self-pay | Source: Home / Self Care | Attending: Internal Medicine

## 2023-01-01 ENCOUNTER — Ambulatory Visit (HOSPITAL_COMMUNITY)
Admission: RE | Admit: 2023-01-01 | Discharge: 2023-01-01 | Disposition: A | Discharge status: Home / Self Care | Payer: Medicare Other | Attending: Internal Medicine | Admitting: Internal Medicine

## 2023-01-01 ENCOUNTER — Other Ambulatory Visit: Payer: Self-pay

## 2023-01-01 DIAGNOSIS — I495 Sick sinus syndrome: Secondary | ICD-10-CM | POA: Diagnosis not present

## 2023-01-01 DIAGNOSIS — Z79899 Other long term (current) drug therapy: Secondary | ICD-10-CM | POA: Diagnosis not present

## 2023-01-01 DIAGNOSIS — Z7901 Long term (current) use of anticoagulants: Secondary | ICD-10-CM | POA: Diagnosis not present

## 2023-01-01 DIAGNOSIS — I10 Essential (primary) hypertension: Secondary | ICD-10-CM | POA: Insufficient documentation

## 2023-01-01 DIAGNOSIS — I480 Paroxysmal atrial fibrillation: Secondary | ICD-10-CM | POA: Diagnosis not present

## 2023-01-01 DIAGNOSIS — Z45010 Encounter for checking and testing of cardiac pacemaker pulse generator [battery]: Secondary | ICD-10-CM | POA: Diagnosis not present

## 2023-01-01 HISTORY — PX: PPM GENERATOR CHANGEOUT: EP1233

## 2023-01-01 SURGERY — PPM GENERATOR CHANGEOUT

## 2023-01-01 MED ORDER — ACETAMINOPHEN 325 MG PO TABS: 325.0000 mg | ORAL_TABLET | ORAL | Status: DC | PRN

## 2023-01-01 MED ORDER — MIDAZOLAM HCL 5 MG/5ML IJ SOLN
INTRAMUSCULAR | Status: DC | PRN
  Administered 2023-01-01: 1 mg via INTRAVENOUS

## 2023-01-01 MED ORDER — SODIUM CHLORIDE 0.9 % IV SOLN: 80.0000 mg | INTRAVENOUS | Status: AC

## 2023-01-01 MED ORDER — LIDOCAINE HCL (PF) 1 % IJ SOLN
INTRAMUSCULAR | Status: DC | PRN
  Administered 2023-01-01: 50 mL

## 2023-01-01 MED ORDER — SODIUM CHLORIDE 0.9 % IV SOLN
INTRAVENOUS | Status: AC
  Administered 2023-01-01: 80 mg
  Filled 2023-01-01: qty 2

## 2023-01-01 MED ORDER — FENTANYL CITRATE (PF) 100 MCG/2ML IJ SOLN
INTRAMUSCULAR | Status: AC
  Filled 2023-01-01: qty 2

## 2023-01-01 MED ORDER — CEFAZOLIN SODIUM-DEXTROSE 2-4 GM/100ML-% IV SOLN
INTRAVENOUS | Status: AC
  Administered 2023-01-01: 2 g via INTRAVENOUS
  Filled 2023-01-01: qty 100

## 2023-01-01 MED ORDER — FENTANYL CITRATE (PF) 100 MCG/2ML IJ SOLN
INTRAMUSCULAR | Status: DC | PRN
  Administered 2023-01-01: 12.5 ug via INTRAVENOUS

## 2023-01-01 MED ORDER — HEPARIN (PORCINE) IN NACL 1000-0.9 UT/500ML-% IV SOLN: INTRAVENOUS | Status: DC | PRN

## 2023-01-01 MED ORDER — MIDAZOLAM HCL 2 MG/2ML IJ SOLN
INTRAMUSCULAR | Status: AC
  Filled 2023-01-01: qty 2

## 2023-01-01 MED ORDER — ONDANSETRON HCL 4 MG/2ML IJ SOLN: 4.0000 mg | Freq: Four times a day (QID) | INTRAMUSCULAR | Status: DC | PRN

## 2023-01-01 MED ORDER — CEFAZOLIN SODIUM-DEXTROSE 2-4 GM/100ML-% IV SOLN: 2.0000 g | INTRAVENOUS | Status: AC

## 2023-01-01 MED ORDER — POVIDONE-IODINE 10 % EX SWAB
2.0000 | Freq: Once | CUTANEOUS | Status: AC
  Administered 2023-01-01: 2 via TOPICAL

## 2023-01-01 MED ORDER — SODIUM CHLORIDE 0.9 % IV SOLN: INTRAVENOUS | Status: DC

## 2023-01-01 SURGICAL SUPPLY — 6 items
CABLE SURGICAL S-101-97-12 (CABLE) ×1 IMPLANT
PACEMAKER ACCOLADE DR-EL (Pacemaker) IMPLANT
PAD DEFIB RADIO PHYSIO CONN (PAD) ×1 IMPLANT
POUCH AIGIS-R ANTIBACT PPM (Mesh General) ×1 IMPLANT
POUCH AIGIS-R ANTIBACT PPM MED (Mesh General) IMPLANT
TRAY PACEMAKER INSERTION (PACKS) ×1 IMPLANT

## 2023-01-01 NOTE — Discharge Instructions (Signed)

## 2023-01-01 NOTE — H&P (Signed)
    HPI Patrick Mccall returns today for followup. He is a pleasant 77 yo man with PAF, symptomatic tachy-brady, s/p PPM insertion. He denies sob or chest pain. Minimal palpitations. No edema. His [place] Sci PPM is on recall. He has been asymptomatic. He has 2 years of battery longevity No Known Allergies   Current Outpatient Medications  Medication Sig Dispense Refill   acetaminophen (TYLENOL) 500 MG tablet Take 500 mg by mouth every 6 (six) hours as needed for pain.     flecainide (TAMBOCOR) 100 MG tablet TAKE ONE TABLET BY MOUTH TWICE DAILY 180 tablet 0   losartan (COZAAR) 100 MG tablet Take 100 mg by mouth daily.     metoprolol tartrate (LOPRESSOR) 100 MG tablet TAKE ONE TABLET TWICE DAILY 180 tablet 3   Multiple Vitamins-Minerals (MULTIVITAMIN WITH MINERALS) tablet Take 1 tablet by mouth daily.     Omega-3 Fatty Acids (FISH OIL) 1000 MG CAPS Take 1 capsule by mouth daily.     simvastatin (ZOCOR) 40 MG tablet Take 40 mg by mouth every evening.     XARELTO 20 MG TABS tablet TAKE ONE TABLET BY MOUTH DAILY WITH SUPPER 30 tablet 5   No current facility-administered medications for this visit.     Past Medical History:  Diagnosis Date   Factor V Leiden deficiency    HLD (hyperlipidemia)    Hypertension    PAF (paroxysmal atrial fibrillation) (HCC)    aflutter ablation 02/2013, pacer implant   Sleep apnea     ROS:   All systems reviewed and negative except as noted in the HPI.   Past Surgical History:  Procedure Laterality Date   ATRIAL FLUTTER ABLATION N/A 03/06/2013   Procedure: ATRIAL FLUTTER ABLATION;  Surgeon: Earla Charlie W Kylle Lall, MD;  Location: [facility] CATH [facility];  Service: Cardiovascular;  Laterality: N/A;   CARDIAC CATHETERIZATION  02/2013   nl coronaries   HERNIA REPAIR     LEFT HEART CATHETERIZATION WITH CORONARY ANGIOGRAM N/A 03/04/2013   Procedure: LEFT HEART CATHETERIZATION WITH CORONARY ANGIOGRAM;  Surgeon: Jonathan J Berry, MD;  Location: [facility] CATH LAB;  Service:  Cardiovascular;  Laterality: N/A;   PACEMAKER INSERTION     BS, pauses and sinus brady   PERMANENT PACEMAKER INSERTION N/A 03/06/2013   Procedure: PERMANENT PACEMAKER INSERTION;  Surgeon: Linnaea Ahn W Moniqua Engebretsen, MD;  Location: [facility] CATH LAB;  Service: Cardiovascular;  Laterality: N/A;     Family History  Problem Relation Age of Onset   Heart attack Mother    Heart attack Father    Heart attack Maternal Grandfather    Heart attack Maternal Grandmother    Heart attack Paternal Grandfather    Heart attack Paternal Grandmother      Social History   Socioeconomic History   Marital status: Married    Spouse name: Not on file   Number of children: Not on file   Years of education: Not on file   Highest education level: Not on file  Occupational History   Not on file  Tobacco Use   Smoking status: Never   Smokeless tobacco: Never  Substance and Sexual Activity   Alcohol use: Yes    Alcohol/week: 1.0 - 2.0 standard drink of alcohol    Types: 1 - 2 Standard drinks or equivalent per week   Drug use: No   Sexual activity: Not on file  Other Topics Concern   Not on file  Social History Narrative   Exercises five times per week doing cardio.     Social Determinants of Health   Financial Resource Strain: Not on file  Food Insecurity: Not on file  Transportation Needs: Not on file  Physical Activity: Not on file  Stress: Not on file  Social Connections: Not on file  Intimate Partner Violence: Not on file     BP (!) 144/72   Pulse 70   Ht 5' 3" (1.6 m)   Wt 136 lb (61.7 kg)   SpO2 96%   BMI 24.09 kg/m   Physical Exam:  Well appearing NAD HEENT: Unremarkable Neck:  No JVD, no thyromegally Lymphatics:  No adenopathy Back:  No CVA tenderness Lungs:  Clear HEART:  Regular rate rhythm, no murmurs, no rubs, no clicks Abd:  soft, positive bowel sounds, no organomegally, no rebound, no guarding Ext:  2 plus pulses, no edema, no cyanosis, no clubbing Skin:  No rashes no  nodules Neuro:  CN II through XII intact, motor grossly intact  EKG - nsr with atrial pacing  DEVICE  Normal device function.  See PaceArt for details.   Assess/Plan:  Sinus node dysfunction - he is asymptomatic s/p [place] Sci PPM insertion. He will undergo PPM gen change as his device has a high likelihood of failure. PAF - he will continue flecainide 100 mg twice daily HTN - his sbp is up a bit. He is encouraged to continue his current meds and maintain a low sodium diet. Coags - he has had no trouble with xarelto and no bleeding   Karee Christopherson,MD 

## 2023-01-01 NOTE — Progress Notes (Signed)
Per Dr. Ladona Ridgel, patient can re-start Xarelto on 4/13.

## 2023-01-02 ENCOUNTER — Encounter (HOSPITAL_COMMUNITY): Payer: Self-pay | Admitting: Internal Medicine

## 2023-01-03 MED FILL — Midazolam HCl Inj 2 MG/2ML (Base Equivalent): INTRAMUSCULAR | Qty: 1 | Status: AC

## 2023-01-09 ENCOUNTER — Telehealth: Payer: Self-pay | Admitting: *Deleted

## 2023-01-09 NOTE — Telephone Encounter (Signed)
   Pre-operative Risk Assessment    Patient Name: Patrick Mccall  DOB: 25-Jun-1946 MRN: [id number]      Request for Surgical Clearance    Procedure:   COLONOSCOPY  Date of Surgery:  Clearance TBD       (PER NOTE, WILL BE SOMETIME IN JULY)                          Surgeon:  DR. Shanda Howells Surgeon's Group or Practice Name:  [facility] Phone number:   Fax number:  [phone]   Type of Clearance Requested:   - Pharmacy:  Hold Rivaroxaban (Xarelto) X'S 2 DAYS   Type of Anesthesia:  Not Indicated   Additional requests/questions:    Signed, Elliot Cousin   01/09/2023, 8:27 AM

## 2023-01-09 NOTE — Telephone Encounter (Signed)
Pt see's EP Dr. Ladona Ridgel. Pt has been scheduled for tele preop appt 02/26/23 2 pm. Med rec and consent are done.     Patient Consent for Virtual Visit        Patrick Mccall has provided verbal consent on 01/09/2023 for a virtual visit (video or telephone).   CONSENT FOR VIRTUAL VISIT FOR:  Patrick Mccall  By participating in this virtual visit I agree to the following:  I hereby voluntarily request, consent and authorize [facility] and its employed or contracted physicians, physician assistants, nurse practitioners or other licensed health care professionals (the Practitioner), to provide me with telemedicine health care services (the "Services") as deemed necessary by the treating Practitioner. I acknowledge and consent to receive the Services by the Practitioner via telemedicine. I understand that the telemedicine visit will involve communicating with the Practitioner through live audiovisual communication technology and the disclosure of certain medical information by electronic transmission. I acknowledge that I have been given the opportunity to request an in-person assessment or other available alternative prior to the telemedicine visit and am voluntarily participating in the telemedicine visit.  I understand that I have the right to withhold or withdraw my consent to the use of telemedicine in the course of my care at any time, without affecting my right to future care or treatment, and that the Practitioner or I may terminate the telemedicine visit at any time. I understand that I have the right to inspect all information obtained and/or recorded in the course of the telemedicine visit and may receive copies of available information for a reasonable fee.  I understand that some of the potential risks of receiving the Services via telemedicine include:  Delay or interruption in medical evaluation due to technological equipment failure or disruption; Information transmitted may not be  sufficient (e.g. poor resolution of images) to allow for appropriate medical decision making by the Practitioner; and/or  In rare instances, security protocols could fail, causing a breach of personal health information.  Furthermore, I acknowledge that it is my responsibility to provide information about my medical history, conditions and care that is complete and accurate to the best of my ability. I acknowledge that Practitioner's advice, recommendations, and/or decision may be based on factors not within their control, such as incomplete or inaccurate data provided by me or distortions of diagnostic images or specimens that may result from electronic transmissions. I understand that the practice of medicine is not an exact science and that Practitioner makes no warranties or guarantees regarding treatment outcomes. I acknowledge that a copy of this consent can be made available to me via my patient portal [facility] MyChart), or I can request a printed copy by calling the office of [facility].    I understand that my insurance will be billed for this visit.   I have read or had this consent read to me. I understand the contents of this consent, which adequately explains the benefits and risks of the Services being provided via telemedicine.  I have been provided ample opportunity to ask questions regarding this consent and the Services and have had my questions answered to my satisfaction. I give my informed consent for the services to be provided through the use of telemedicine in my medical care

## 2023-01-09 NOTE — Telephone Encounter (Signed)
Pt see's EP Dr. Ladona Ridgel. Pt has been scheduled for tele preop appt 02/26/23 2 pm. Med rec and consent are done.

## 2023-01-09 NOTE — Telephone Encounter (Signed)
Patient with diagnosis of atrial fibrillation on Xarelto for anticoagulation.    Procedure: colonoscopy Date of procedure: TBD   CHA2DS2-VASc Score = 3   This indicates a 3.2% annual risk of stroke. The patient's score is based upon: CHF History: 0 HTN History: 1 Diabetes History: 0 Stroke History: 0 Vascular Disease History: 0 Age Score: 2 Gender Score: 0    CrCl 67 Platelet count 208  Per office protocol, patient can hold Xarelto for 2 days prior to procedure.   Patient will not need bridging with Lovenox (enoxaparin) around procedure.  **This guidance is not considered finalized until pre-operative APP has relayed final recommendations.**

## 2023-01-09 NOTE — Telephone Encounter (Signed)
   Name: Patrick Mccall  DOB: 06/04/1946  MRN: [id number]  Primary Cardiologist: None  Chart reviewed as part of pre-operative protocol coverage. Because of Olie Blakney's past medical history and time since last visit, he will require a follow-up telephone visit in order to better assess preoperative cardiovascular risk.  Pre-op covering staff: - Please schedule appointment and call patient to inform them. If patient already had an upcoming appointment within acceptable timeframe, please add "pre-op clearance" to the appointment notes so provider is aware. - Please contact requesting surgeon's office via preferred method (i.e, phone, fax) to inform them of need for appointment prior to surgery.  Per office protocol, patient can hold Xarelto for 2 days prior to procedure.   Patient will not need bridging with Lovenox (enoxaparin) around procedure.  Sharlene Dory, PA-C  01/09/2023, 12:45 PM

## 2023-01-09 NOTE — Telephone Encounter (Signed)
  Patient Consent for Virtual Visit        Patrick Mccall has provided verbal consent on 01/09/2023 for a virtual visit (video or telephone).   CONSENT FOR VIRTUAL VISIT FOR:  Patrick Mccall  By participating in this virtual visit I agree to the following:  I hereby voluntarily request, consent and authorize [facility] and its employed or contracted physicians, physician assistants, nurse practitioners or other licensed health care professionals (the Practitioner), to provide me with telemedicine health care services (the "Services") as deemed necessary by the treating Practitioner. I acknowledge and consent to receive the Services by the Practitioner via telemedicine. I understand that the telemedicine visit will involve communicating with the Practitioner through live audiovisual communication technology and the disclosure of certain medical information by electronic transmission. I acknowledge that I have been given the opportunity to request an in-person assessment or other available alternative prior to the telemedicine visit and am voluntarily participating in the telemedicine visit.  I understand that I have the right to withhold or withdraw my consent to the use of telemedicine in the course of my care at any time, without affecting my right to future care or treatment, and that the Practitioner or I may terminate the telemedicine visit at any time. I understand that I have the right to inspect all information obtained and/or recorded in the course of the telemedicine visit and may receive copies of available information for a reasonable fee.  I understand that some of the potential risks of receiving the Services via telemedicine include:  Delay or interruption in medical evaluation due to technological equipment failure or disruption; Information transmitted may not be sufficient (e.g. poor resolution of images) to allow for appropriate medical decision making by the Practitioner;  and/or  In rare instances, security protocols could fail, causing a breach of personal health information.  Furthermore, I acknowledge that it is my responsibility to provide information about my medical history, conditions and care that is complete and accurate to the best of my ability. I acknowledge that Practitioner's advice, recommendations, and/or decision may be based on factors not within their control, such as incomplete or inaccurate data provided by me or distortions of diagnostic images or specimens that may result from electronic transmissions. I understand that the practice of medicine is not an exact science and that Practitioner makes no warranties or guarantees regarding treatment outcomes. I acknowledge that a copy of this consent can be made available to me via my patient portal [facility] MyChart), or I can request a printed copy by calling the office of [facility].    I understand that my insurance will be billed for this visit.   I have read or had this consent read to me. I understand the contents of this consent, which adequately explains the benefits and risks of the Services being provided via telemedicine.  I have been provided ample opportunity to ask questions regarding this consent and the Services and have had my questions answered to my satisfaction. I give my informed consent for the services to be provided through the use of telemedicine in my medical care

## 2023-01-11 ENCOUNTER — Ambulatory Visit: Payer: Medicare Other

## 2023-01-11 ENCOUNTER — Other Ambulatory Visit: Payer: Self-pay | Admitting: Internal Medicine

## 2023-01-17 ENCOUNTER — Ambulatory Visit: Payer: Medicare Other | Attending: Cardiovascular Disease

## 2023-01-17 DIAGNOSIS — I495 Sick sinus syndrome: Secondary | ICD-10-CM | POA: Diagnosis not present

## 2023-01-17 LAB — CUP PACEART INCLINIC DEVICE CHECK
Date Time Interrogation Session: 20240424210830
Implantable Lead Connection Status: 753985
Implantable Lead Connection Status: 753985
Implantable Lead Implant Date: 20140612
Implantable Lead Implant Date: 20140612
Implantable Lead Location: 753859
Implantable Lead Location: 753860
Implantable Lead Model: 4135
Implantable Lead Model: 4136
Implantable Lead Serial Number: 29410465
Implantable Lead Serial Number: 29414571
Implantable Pulse Generator Implant Date: 20240408
Lead Channel Impedance Value: 619 Ohm
Lead Channel Impedance Value: 795 Ohm
Lead Channel Pacing Threshold Amplitude: 0.8 V
Lead Channel Pacing Threshold Amplitude: 1.5 V
Lead Channel Pacing Threshold Pulse Width: 0.4 ms
Lead Channel Pacing Threshold Pulse Width: 0.4 ms
Lead Channel Sensing Intrinsic Amplitude: 13.8 mV
Lead Channel Sensing Intrinsic Amplitude: 4.8 mV
Lead Channel Setting Pacing Amplitude: 2 V
Lead Channel Setting Pacing Amplitude: 2.5 V
Lead Channel Setting Pacing Pulse Width: 0.4 ms
Lead Channel Setting Sensing Sensitivity: 2.5 mV
Pulse Gen Serial Number: 662227
Zone Setting Status: 755011

## 2023-01-17 NOTE — Progress Notes (Signed)
Wound check appointment. Steri-strips removed. Wound without redness or edema. Incision edges approximated, wound well healed. Normal device function. Thresholds, sensing, and impedances consistent with implant measurements. Device programmed safety margins appropriate for chronic leads. Histogram distribution appropriate for patient and level of activity. No mode switches or high ventricular rates noted. Patient educated about wound care.  Gen change only, no further restrictions on arm movement. ROV in 3 months with implanting physician.

## 2023-01-17 NOTE — Patient Instructions (Signed)

## 2023-01-23 NOTE — Telephone Encounter (Signed)
OUR OFFICE RECEIVED A DUPLICATE REQUEST.   I will update the requesting office the pt has tele visit 02/26/23. Once the pt has been cleared wwe will fax notes to requesting office.   I will send FYI to requesting office.

## 2023-02-26 ENCOUNTER — Ambulatory Visit: Payer: Medicare Other | Attending: Cardiology | Admitting: Student

## 2023-02-26 DIAGNOSIS — Z01810 Encounter for preprocedural cardiovascular examination: Secondary | ICD-10-CM

## 2023-02-26 NOTE — Progress Notes (Signed)
Virtual Visit via Telephone Note   Because of Patrick Mccall's co-morbid illnesses, he is at least at moderate risk for complications without adequate follow up.  This format is felt to be most appropriate for this patient at this time.  The patient did not have access to video technology/had technical difficulties with video requiring transitioning to audio format only (telephone).  All issues noted in this document were discussed and addressed.  No physical exam could be performed with this format.  Please refer to the patient's chart for his consent to telehealth for [facility].  Evaluation Performed:  Preoperative cardiovascular risk assessment _____________   Date:  02/26/2023   Patient ID:  Patrick Mccall, DOB Apr 10, 1946, MRN [id number] Patient Location:  Home Provider location:   Office  Primary Care Provider:  Ailene Ravel, MD Primary EP:  Dr. Lewayne Bunting  Chief Complaint / Patient Profile   77 y.o. y/o male with a h/o PAF on anticoagulation, symptomatic tachybradycardia syndrome s/p PPM with GEN change 01/01/2023, hypertension, hyperlipidemia who is pending colonoscopy by Dr. Jennye Boroughs and presents today for telephonic preoperative cardiovascular risk assessment.  History of Present Illness    Patrick Mccall is a 77 y.o. male who presents via audio/video conferencing for a telehealth visit today.  Pt was last seen in cardiology clinic on 11/10/2022 by Dr. Ladona Ridgel.  At that time Vandon Buchmann was doing well from a cardiac standpoint.  The patient is now pending procedure as outlined above. Since his last visit, he is doing well. Patient denies shortness of breath or dyspnea on exertion. No chest pain, pressure, or tightness. Denies lower extremity edema, orthopnea, or PND. He has occaisonal, brief palpitations lasting 2-3 minutes. He is very active walking on a treadmill 5 days a week and going to the gym to ride the stationary bike for 30 minutes and lift weights 4  days a week.  Past Medical History    Past Medical History:  Diagnosis Date   Factor V Leiden deficiency    HLD (hyperlipidemia)    Hypertension    PAF (paroxysmal atrial fibrillation) (HCC)    aflutter ablation 02/2013, pacer implant   Sleep apnea    Past Surgical History:  Procedure Laterality Date   ATRIAL FLUTTER ABLATION N/A 03/06/2013   Procedure: ATRIAL FLUTTER ABLATION;  Surgeon: Marinus Maw, MD;  Location: [facility] CATH LAB;  Service: Cardiovascular;  Laterality: N/A;   CARDIAC CATHETERIZATION  02/2013   nl coronaries   HERNIA REPAIR     LEFT HEART CATHETERIZATION WITH CORONARY ANGIOGRAM N/A 03/04/2013   Procedure: LEFT HEART CATHETERIZATION WITH CORONARY ANGIOGRAM;  Surgeon: Runell Gess, MD;  Location: [facility] CATH LAB;  Service: Cardiovascular;  Laterality: N/A;   PACEMAKER INSERTION     BS, pauses and sinus brady   PERMANENT PACEMAKER INSERTION N/A 03/06/2013   Procedure: PERMANENT PACEMAKER INSERTION;  Surgeon: Marinus Maw, MD;  Location: [facility] CATH LAB;  Service: Cardiovascular;  Laterality: N/A;   PPM GENERATOR CHANGEOUT N/A 01/01/2023   Procedure: PPM GENERATOR CHANGEOUT;  Surgeon: Marinus Maw, MD;  Location: [facility] INVASIVE CV LAB;  Service: Cardiovascular;  Laterality: N/A;    Allergies  No Known Allergies  Home Medications    Prior to Admission medications   Medication Sig Start Date End Date Taking? Authorizing Provider  acetaminophen (TYLENOL) 500 MG tablet Take 500 mg by mouth every 6 (six) hours as needed for pain.    [provider]  flecainide (TAMBOCOR) 100 MG tablet  TAKE ONE TABLET BY MOUTH TWICE DAILY 01/11/23   Marinus Maw, MD  losartan (COZAAR) 100 MG tablet Take 100 mg by mouth daily. 10/23/22   [provider]  metoprolol tartrate (LOPRESSOR) 100 MG tablet TAKE ONE TABLET TWICE DAILY 07/17/22   Marinus Maw, MD  Multiple Vitamins-Minerals (MULTIVITAMIN WITH MINERALS) tablet Take 1 tablet by mouth daily.    [provider]   Omega-3 Fatty Acids (FISH OIL) 1000 MG CAPS Take 1 capsule by mouth daily.    [provider]  simvastatin (ZOCOR) 40 MG tablet Take 40 mg by mouth at bedtime.    [provider]  XARELTO 20 MG TABS tablet TAKE ONE TABLET BY MOUTH DAILY WITH SUPPER 12/12/22   Marinus Maw, MD    Physical Exam    Vital Signs:  Jermari Begnaud does not have vital signs available for review today.  Given telephonic nature of communication, physical exam is limited. AAOx3. NAD. Normal affect.  Speech and respirations are unlabored.  Accessory Clinical Findings    None  Assessment & Plan    Primary EP: Dr. Lewayne Bunting  Preoperative cardiovascular risk assessment.  Colonoscopy by Dr. Jennye Boroughs.  Chart reviewed as part of pre-operative protocol coverage. According to the RCRI, patient has a 0.4% risk of MACE. Patient reports activity equivalent to >4.0 METS (Walks on treadmill 30 minutes 5 days a week, rides stationary bike 30 minutes and uses weight machines 4 days a week).   Given past medical history and time since last visit, based on ACC/AHA guidelines, Lb Maughan would be at acceptable risk for the planned procedure without further cardiovascular testing.   Patient was advised that if he develops new symptoms prior to surgery to contact our office to arrange a follow-up appointment.  he verbalized understanding.  2. SBE/Anti-coag.  Per Pharm.D.: Patient with diagnosis of atrial fibrillation on Xarelto for anticoagulation.     Procedure: colonoscopy Date of procedure: TBD     CHA2DS2-VASc Score = 3   This indicates a 3.2% annual risk of stroke. The patient's score is based upon: CHF History: 0 HTN History: 1 Diabetes History: 0 Stroke History: 0 Vascular Disease History: 0 Age Score: 2 Gender Score: 0     CrCl 67 Platelet count 208   Per office protocol, patient can hold Xarelto for 2 days prior to procedure.   Patient will not need bridging with Lovenox  (enoxaparin) around procedure.  I will route this recommendation to the requesting party via Epic fax function.  Please call with questions.  Time:   Today, I have spent 5 minutes with the patient with telehealth technology discussing medical history, symptoms, and management plan.     Carlos Levering, NP  02/26/2023, 8:29 AM

## 2023-03-07 DIAGNOSIS — G4733 Obstructive sleep apnea (adult) (pediatric): Secondary | ICD-10-CM | POA: Diagnosis not present

## 2023-03-19 DIAGNOSIS — D6869 Other thrombophilia: Secondary | ICD-10-CM | POA: Diagnosis not present

## 2023-03-22 ENCOUNTER — Other Ambulatory Visit: Payer: Self-pay

## 2023-03-22 DIAGNOSIS — I480 Paroxysmal atrial fibrillation: Secondary | ICD-10-CM

## 2023-03-22 MED ORDER — RIVAROXABAN 20 MG PO TABS
ORAL_TABLET | ORAL | 1 refills | Status: DC
Start: 2023-03-22 — End: 2023-04-18

## 2023-03-22 NOTE — Telephone Encounter (Signed)
Prescription refill request for Xarelto received.  Indication: Afib  Last office visit: 02/26/23 ([place])  Weight: 59kg Age: 77 Scr: 0.82 (12/26/22)  CrCl: 63.14ml/min  Appropriate dose. Refill sent.

## 2023-03-26 DIAGNOSIS — D6851 Activated protein C resistance: Secondary | ICD-10-CM | POA: Diagnosis not present

## 2023-03-26 DIAGNOSIS — K5790 Diverticulosis of intestine, part unspecified, without perforation or abscess without bleeding: Secondary | ICD-10-CM | POA: Diagnosis not present

## 2023-03-28 DIAGNOSIS — I10 Essential (primary) hypertension: Secondary | ICD-10-CM | POA: Diagnosis not present

## 2023-04-03 ENCOUNTER — Ambulatory Visit (INDEPENDENT_AMBULATORY_CARE_PROVIDER_SITE_OTHER): Payer: Medicare Other

## 2023-04-03 DIAGNOSIS — I495 Sick sinus syndrome: Secondary | ICD-10-CM | POA: Diagnosis not present

## 2023-04-04 LAB — CUP PACEART REMOTE DEVICE CHECK
Battery Remaining Longevity: 168 mo
Battery Remaining Percentage: 100 %
Brady Statistic RA Percent Paced: 99 %
Brady Statistic RV Percent Paced: 1 %
Date Time Interrogation Session: 20240710123700
Implantable Lead Connection Status: 753985
Implantable Lead Connection Status: 753985
Implantable Lead Implant Date: 20140612
Implantable Lead Implant Date: 20140612
Implantable Lead Location: 753859
Implantable Lead Location: 753860
Implantable Lead Model: 4135
Implantable Lead Model: 4136
Implantable Lead Serial Number: 29410465
Implantable Lead Serial Number: 29414571
Implantable Pulse Generator Implant Date: 20240408
Lead Channel Impedance Value: 600 Ohm
Lead Channel Impedance Value: 749 Ohm
Lead Channel Pacing Threshold Amplitude: 0.6 V
Lead Channel Pacing Threshold Amplitude: 1.8 V
Lead Channel Pacing Threshold Pulse Width: 0.4 ms
Lead Channel Pacing Threshold Pulse Width: 0.4 ms
Lead Channel Setting Pacing Amplitude: 2 V
Lead Channel Setting Pacing Amplitude: 2.5 V
Lead Channel Setting Pacing Pulse Width: 0.4 ms
Lead Channel Setting Sensing Sensitivity: 2.5 mV
Pulse Gen Serial Number: 662227
Zone Setting Status: 755011

## 2023-04-10 DIAGNOSIS — I4891 Unspecified atrial fibrillation: Secondary | ICD-10-CM | POA: Diagnosis not present

## 2023-04-10 DIAGNOSIS — E782 Mixed hyperlipidemia: Secondary | ICD-10-CM | POA: Diagnosis not present

## 2023-04-10 DIAGNOSIS — I495 Sick sinus syndrome: Secondary | ICD-10-CM | POA: Diagnosis not present

## 2023-04-10 DIAGNOSIS — Z79899 Other long term (current) drug therapy: Secondary | ICD-10-CM | POA: Diagnosis not present

## 2023-04-10 DIAGNOSIS — I10 Essential (primary) hypertension: Secondary | ICD-10-CM | POA: Diagnosis not present

## 2023-04-12 ENCOUNTER — Ambulatory Visit: Payer: Medicare Other

## 2023-04-12 ENCOUNTER — Encounter: Payer: Self-pay | Admitting: Internal Medicine

## 2023-04-12 ENCOUNTER — Ambulatory Visit: Payer: Medicare Other | Attending: Internal Medicine | Admitting: Internal Medicine

## 2023-04-12 VITALS — BP 158/82 | HR 70 | Ht 62.0 in | Wt 136.0 lb

## 2023-04-12 DIAGNOSIS — I495 Sick sinus syndrome: Secondary | ICD-10-CM

## 2023-04-12 DIAGNOSIS — Z950 Presence of cardiac pacemaker: Secondary | ICD-10-CM

## 2023-04-12 DIAGNOSIS — I480 Paroxysmal atrial fibrillation: Secondary | ICD-10-CM | POA: Diagnosis not present

## 2023-04-12 LAB — CUP PACEART INCLINIC DEVICE CHECK
Brady Statistic RA Percent Paced: 99 %
Brady Statistic RV Percent Paced: 1 % — CL
Date Time Interrogation Session: 20240718144541
Implantable Lead Connection Status: 753985
Implantable Lead Connection Status: 753985
Implantable Lead Implant Date: 20140612
Implantable Lead Implant Date: 20140612
Implantable Lead Location: 753859
Implantable Lead Location: 753860
Implantable Lead Model: 4135
Implantable Lead Model: 4136
Implantable Lead Serial Number: 29410465
Implantable Lead Serial Number: 29414571
Implantable Pulse Generator Implant Date: 20240408
Lead Channel Impedance Value: 590 Ohm
Lead Channel Impedance Value: 764 Ohm
Lead Channel Pacing Threshold Amplitude: 0.9 V
Lead Channel Pacing Threshold Amplitude: 1.4 V
Lead Channel Pacing Threshold Pulse Width: 0.4 ms
Lead Channel Pacing Threshold Pulse Width: 0.4 ms
Lead Channel Sensing Intrinsic Amplitude: 13.5 mV
Lead Channel Sensing Intrinsic Amplitude: 3.1 mV
Lead Channel Setting Pacing Amplitude: 2 V
Lead Channel Setting Pacing Amplitude: 2.5 V
Lead Channel Setting Pacing Pulse Width: 0.4 ms
Lead Channel Setting Sensing Sensitivity: 2.5 mV
Pulse Gen Serial Number: 662227
Zone Setting Status: 755011

## 2023-04-12 NOTE — Progress Notes (Signed)
HPI [name] returns today for followup. He is a pleasant 77 yo man with PAF, symptomatic tachy-brady, s/p PPM insertion. He denies sob or chest pain. Minimal palpitations. No edema. He just got back from a visit to [place].  No Known Allergies   Current Outpatient Medications  Medication Sig Dispense Refill   acetaminophen (TYLENOL) 500 MG tablet Take 500 mg by mouth every 6 (six) hours as needed for pain.     flecainide (TAMBOCOR) 100 MG tablet TAKE ONE TABLET BY MOUTH TWICE DAILY 180 tablet 2   losartan (COZAAR) 100 MG tablet Take 100 mg by mouth daily.     metoprolol tartrate (LOPRESSOR) 100 MG tablet TAKE ONE TABLET TWICE DAILY 180 tablet 3   Multiple Vitamins-Minerals (MULTIVITAMIN WITH MINERALS) tablet Take 1 tablet by mouth daily.     Omega-3 Fatty Acids (FISH OIL) 1000 MG CAPS Take 1 capsule by mouth daily.     rivaroxaban (XARELTO) 20 MG TABS tablet TAKE ONE TABLET BY MOUTH DAILY WITH SUPPER 90 tablet 1   simvastatin (ZOCOR) 40 MG tablet Take 40 mg by mouth at bedtime.     No current facility-administered medications for this visit.     Past Medical History:  Diagnosis Date   Factor V Leiden deficiency    HLD (hyperlipidemia)    Hypertension    PAF (paroxysmal atrial fibrillation) (HCC)    aflutter ablation 02/2013, pacer implant   Sleep apnea     ROS:   All systems reviewed and negative except as noted in the HPI.   Past Surgical History:  Procedure Laterality Date   ATRIAL FLUTTER ABLATION N/A 03/06/2013   Procedure: ATRIAL FLUTTER ABLATION;  Surgeon: Marinus Maw, MD;  Location: [facility] CATH LAB;  Service: Cardiovascular;  Laterality: N/A;   CARDIAC CATHETERIZATION  02/2013   nl coronaries   HERNIA REPAIR     LEFT HEART CATHETERIZATION WITH CORONARY ANGIOGRAM N/A 03/04/2013   Procedure: LEFT HEART CATHETERIZATION WITH CORONARY ANGIOGRAM;  Surgeon: Runell Gess, MD;  Location: [facility] CATH LAB;  Service: Cardiovascular;  Laterality: N/A;   PACEMAKER  INSERTION     BS, pauses and sinus brady   PERMANENT PACEMAKER INSERTION N/A 03/06/2013   Procedure: PERMANENT PACEMAKER INSERTION;  Surgeon: Marinus Maw, MD;  Location: [facility] CATH LAB;  Service: Cardiovascular;  Laterality: N/A;   PPM GENERATOR CHANGEOUT N/A 01/01/2023   Procedure: PPM GENERATOR CHANGEOUT;  Surgeon: Marinus Maw, MD;  Location: [facility] INVASIVE CV LAB;  Service: Cardiovascular;  Laterality: N/A;     Family History  Problem Relation Age of Onset   Heart attack Mother    Heart attack Father    Heart attack Maternal Grandfather    Heart attack Maternal Grandmother    Heart attack Paternal Grandfather    Heart attack Paternal Grandmother      Social History   Socioeconomic History   Marital status: Married    Spouse name: Not on file   Number of children: Not on file   Years of education: Not on file   Highest education level: Not on file  Occupational History   Not on file  Tobacco Use   Smoking status: Never   Smokeless tobacco: Never  Substance and Sexual Activity   Alcohol use: Yes    Alcohol/week: 1.0 - 2.0 standard drink of alcohol    Types: 1 - 2 Standard drinks or equivalent per week   Drug use: No   Sexual activity: Not on  file  Other Topics Concern   Not on file  Social History Narrative   Exercises five times per week doing cardio.   Social Determinants of Health   Financial Resource Strain: Not on file  Food Insecurity: Not on file  Transportation Needs: Not on file  Physical Activity: Not on file  Stress: Not on file  Social Connections: Not on file  Intimate Partner Violence: Not on file     BP (!) 158/82   Pulse 70   Ht 5\' 2"  (1.575 m)   Wt 136 lb (61.7 kg)   SpO2 97%   BMI 24.87 kg/m   Physical Exam:  Well appearing NAD HEENT: Unremarkable Neck:  No JVD, no thyromegally Lymphatics:  No adenopathy Back:  No CVA tenderness Lungs:  Clear HEART:  Regular rate rhythm, no murmurs, no rubs, no clicks Abd:  soft, positive bowel  sounds, no organomegally, no rebound, no guarding Ext:  2 plus pulses, no edema, no cyanosis, no clubbing Skin:  No rashes no nodules Neuro:  CN II through XII intact, motor grossly intact  EKG - nsr with atrial pacing  DEVICE  Normal device function.  See PaceArt for details.   Assess/Plan:  Sinus node dysfunction - he is asymptomatic s/p [place] Sci PPM insertion. He has undergone PPM gen change out and is doing well. PAF - he will continue flecainide 100 mg twice daily. No atrial fib. HTN - his sbp is up a bit. He is encouraged to continue his current meds and maintain a low sodium diet. Coags - he has had no trouble with xarelto and no bleeding   Patrick Mccall

## 2023-04-12 NOTE — Patient Instructions (Signed)
Medication Instructions:  Your physician recommends that you continue on your current medications as directed. Please refer to the Current Medication list given to you today.  *If you need a refill on your cardiac medications before your next appointment, please call your pharmacy*   Lab Work: None ordered.  If you have labs (blood work) drawn today and your tests are completely normal, you will receive your results only by: MyChart Message (if you have MyChart) OR A paper copy in the mail If you have any lab test that is abnormal or we need to change your treatment, we will call you to review the results.   Testing/Procedures: None ordered.    Follow-Up: At [facility], you and your health needs are our priority.  As part of our continuing mission to provide you with exceptional heart care, we have created designated Provider Care Teams.  These Care Teams include your primary Cardiologist (physician) and Advanced Practice Providers (APPs -  Physician Assistants and Nurse Practitioners) who all work together to provide you with the care you need, when you need it.  We recommend signing up for the patient portal called "MyChart".  Sign up information is provided on this After Visit Summary.  MyChart is used to connect with patients for Virtual Visits (Telemedicine).  Patients are able to view lab/test results, encounter notes, upcoming appointments, etc.  Non-urgent messages can be sent to your provider as well.   To learn more about what you can do with MyChart, go to [email or web address].    Your next appointment:   12 months with Dr Ladona Ridgel

## 2023-04-18 ENCOUNTER — Other Ambulatory Visit: Payer: Self-pay

## 2023-04-18 DIAGNOSIS — I480 Paroxysmal atrial fibrillation: Secondary | ICD-10-CM

## 2023-04-18 MED ORDER — RIVAROXABAN 20 MG PO TABS
ORAL_TABLET | ORAL | 1 refills | Status: DC
Start: 2023-04-18 — End: 2023-10-08

## 2023-04-20 DIAGNOSIS — D124 Benign neoplasm of descending colon: Secondary | ICD-10-CM | POA: Diagnosis not present

## 2023-04-20 DIAGNOSIS — K635 Polyp of colon: Secondary | ICD-10-CM | POA: Diagnosis not present

## 2023-04-20 DIAGNOSIS — K644 Residual hemorrhoidal skin tags: Secondary | ICD-10-CM | POA: Diagnosis not present

## 2023-04-20 DIAGNOSIS — Z950 Presence of cardiac pacemaker: Secondary | ICD-10-CM | POA: Diagnosis not present

## 2023-04-20 DIAGNOSIS — I4891 Unspecified atrial fibrillation: Secondary | ICD-10-CM | POA: Diagnosis not present

## 2023-04-20 DIAGNOSIS — I10 Essential (primary) hypertension: Secondary | ICD-10-CM | POA: Diagnosis not present

## 2023-04-20 DIAGNOSIS — K5730 Diverticulosis of large intestine without perforation or abscess without bleeding: Secondary | ICD-10-CM | POA: Diagnosis not present

## 2023-04-20 DIAGNOSIS — D126 Benign neoplasm of colon, unspecified: Secondary | ICD-10-CM | POA: Diagnosis not present

## 2023-04-20 DIAGNOSIS — Z1211 Encounter for screening for malignant neoplasm of colon: Secondary | ICD-10-CM | POA: Diagnosis not present

## 2023-04-26 NOTE — Progress Notes (Signed)
Remote pacemaker transmission.   

## 2023-04-28 DIAGNOSIS — I10 Essential (primary) hypertension: Secondary | ICD-10-CM | POA: Diagnosis not present

## 2023-05-29 DIAGNOSIS — I10 Essential (primary) hypertension: Secondary | ICD-10-CM | POA: Diagnosis not present

## 2023-06-06 DIAGNOSIS — M79651 Pain in right thigh: Secondary | ICD-10-CM | POA: Diagnosis not present

## 2023-06-06 DIAGNOSIS — G4733 Obstructive sleep apnea (adult) (pediatric): Secondary | ICD-10-CM | POA: Diagnosis not present

## 2023-06-08 DIAGNOSIS — M79651 Pain in right thigh: Secondary | ICD-10-CM | POA: Diagnosis not present

## 2023-06-15 DIAGNOSIS — M25551 Pain in right hip: Secondary | ICD-10-CM | POA: Diagnosis not present

## 2023-06-28 DIAGNOSIS — I10 Essential (primary) hypertension: Secondary | ICD-10-CM | POA: Diagnosis not present

## 2023-07-03 ENCOUNTER — Ambulatory Visit: Payer: Medicare Other

## 2023-07-03 DIAGNOSIS — I480 Paroxysmal atrial fibrillation: Secondary | ICD-10-CM

## 2023-07-03 DIAGNOSIS — I495 Sick sinus syndrome: Secondary | ICD-10-CM | POA: Diagnosis not present

## 2023-07-03 LAB — CUP PACEART REMOTE DEVICE CHECK
Battery Remaining Longevity: 168 mo
Battery Remaining Percentage: 100 %
Brady Statistic RA Percent Paced: 98 %
Brady Statistic RV Percent Paced: 1 %
Date Time Interrogation Session: 20241008000100
Implantable Lead Connection Status: 753985
Implantable Lead Connection Status: 753985
Implantable Lead Implant Date: 20140612
Implantable Lead Implant Date: 20140612
Implantable Lead Location: 753859
Implantable Lead Location: 753860
Implantable Lead Model: 4135
Implantable Lead Model: 4136
Implantable Lead Serial Number: 29410465
Implantable Lead Serial Number: 29414571
Implantable Pulse Generator Implant Date: 20240408
Lead Channel Impedance Value: 599 Ohm
Lead Channel Impedance Value: 756 Ohm
Lead Channel Pacing Threshold Amplitude: 0.5 V
Lead Channel Pacing Threshold Amplitude: 1.8 V
Lead Channel Pacing Threshold Pulse Width: 0.4 ms
Lead Channel Pacing Threshold Pulse Width: 0.4 ms
Lead Channel Setting Pacing Amplitude: 2 V
Lead Channel Setting Pacing Amplitude: 2.5 V
Lead Channel Setting Pacing Pulse Width: 0.4 ms
Lead Channel Setting Sensing Sensitivity: 2.5 mV
Pulse Gen Serial Number: 662227
Zone Setting Status: 755011

## 2023-07-06 DIAGNOSIS — Z23 Encounter for immunization: Secondary | ICD-10-CM | POA: Diagnosis not present

## 2023-07-09 ENCOUNTER — Other Ambulatory Visit: Payer: Self-pay | Admitting: Internal Medicine

## 2023-07-10 DIAGNOSIS — Z0000 Encounter for general adult medical examination without abnormal findings: Secondary | ICD-10-CM | POA: Diagnosis not present

## 2023-07-10 DIAGNOSIS — Z1331 Encounter for screening for depression: Secondary | ICD-10-CM | POA: Diagnosis not present

## 2023-07-10 DIAGNOSIS — Z9181 History of falling: Secondary | ICD-10-CM | POA: Diagnosis not present

## 2023-07-10 DIAGNOSIS — Z139 Encounter for screening, unspecified: Secondary | ICD-10-CM | POA: Diagnosis not present

## 2023-07-12 ENCOUNTER — Ambulatory Visit: Payer: Medicare Other

## 2023-07-19 NOTE — Progress Notes (Signed)
Remote pacemaker transmission.   

## 2023-07-27 DIAGNOSIS — Z23 Encounter for immunization: Secondary | ICD-10-CM | POA: Diagnosis not present

## 2023-07-29 DIAGNOSIS — I10 Essential (primary) hypertension: Secondary | ICD-10-CM | POA: Diagnosis not present

## 2023-08-29 DIAGNOSIS — I10 Essential (primary) hypertension: Secondary | ICD-10-CM | POA: Diagnosis not present

## 2023-09-04 DIAGNOSIS — G4733 Obstructive sleep apnea (adult) (pediatric): Secondary | ICD-10-CM | POA: Diagnosis not present

## 2023-09-29 DIAGNOSIS — I10 Essential (primary) hypertension: Secondary | ICD-10-CM | POA: Diagnosis not present

## 2023-10-02 ENCOUNTER — Ambulatory Visit (INDEPENDENT_AMBULATORY_CARE_PROVIDER_SITE_OTHER): Payer: Medicare Other

## 2023-10-02 DIAGNOSIS — I495 Sick sinus syndrome: Secondary | ICD-10-CM

## 2023-10-03 LAB — CUP PACEART REMOTE DEVICE CHECK
Battery Remaining Longevity: 162 mo
Battery Remaining Percentage: 100 %
Brady Statistic RA Percent Paced: 98 %
Brady Statistic RV Percent Paced: 3 %
Date Time Interrogation Session: 20250107000100
Implantable Lead Connection Status: 753985
Implantable Lead Connection Status: 753985
Implantable Lead Implant Date: 20140612
Implantable Lead Implant Date: 20140612
Implantable Lead Location: 753859
Implantable Lead Location: 753860
Implantable Lead Model: 4135
Implantable Lead Model: 4136
Implantable Lead Serial Number: 29410465
Implantable Lead Serial Number: 29414571
Implantable Pulse Generator Implant Date: 20240408
Lead Channel Impedance Value: 600 Ohm
Lead Channel Impedance Value: 743 Ohm
Lead Channel Pacing Threshold Amplitude: 0.6 V
Lead Channel Pacing Threshold Amplitude: 1.6 V
Lead Channel Pacing Threshold Pulse Width: 0.4 ms
Lead Channel Pacing Threshold Pulse Width: 0.4 ms
Lead Channel Setting Pacing Amplitude: 2 V
Lead Channel Setting Pacing Amplitude: 2.5 V
Lead Channel Setting Pacing Pulse Width: 0.4 ms
Lead Channel Setting Sensing Sensitivity: 2.5 mV
Pulse Gen Serial Number: 662227
Zone Setting Status: 755011

## 2023-10-08 ENCOUNTER — Other Ambulatory Visit: Payer: Self-pay

## 2023-10-08 DIAGNOSIS — I480 Paroxysmal atrial fibrillation: Secondary | ICD-10-CM

## 2023-10-08 MED ORDER — RIVAROXABAN 20 MG PO TABS
ORAL_TABLET | ORAL | 1 refills | Status: DC
Start: 2023-10-08 — End: 2024-04-09

## 2023-10-08 NOTE — Telephone Encounter (Signed)
 Prescription refill request for Xarelto received.  Indication: Afib  Last office visit: 04/12/23 Ladona Ridgel)  Weight: 61.7kg Age: 78 Scr: 0.82 (12/26/22)  CrCl: 66.63ml/min  Appropriate dose. Refill sent.

## 2023-10-10 ENCOUNTER — Other Ambulatory Visit: Payer: Self-pay | Admitting: Internal Medicine

## 2023-10-11 ENCOUNTER — Ambulatory Visit: Payer: Medicare Other

## 2023-10-15 DIAGNOSIS — I4891 Unspecified atrial fibrillation: Secondary | ICD-10-CM | POA: Diagnosis not present

## 2023-10-15 DIAGNOSIS — Z79899 Other long term (current) drug therapy: Secondary | ICD-10-CM | POA: Diagnosis not present

## 2023-10-15 DIAGNOSIS — I10 Essential (primary) hypertension: Secondary | ICD-10-CM | POA: Diagnosis not present

## 2023-10-15 DIAGNOSIS — I495 Sick sinus syndrome: Secondary | ICD-10-CM | POA: Diagnosis not present

## 2023-10-15 DIAGNOSIS — E782 Mixed hyperlipidemia: Secondary | ICD-10-CM | POA: Diagnosis not present

## 2023-10-24 DIAGNOSIS — H524 Presbyopia: Secondary | ICD-10-CM | POA: Diagnosis not present

## 2023-10-30 DIAGNOSIS — I10 Essential (primary) hypertension: Secondary | ICD-10-CM | POA: Diagnosis not present

## 2023-11-07 DIAGNOSIS — L578 Other skin changes due to chronic exposure to nonionizing radiation: Secondary | ICD-10-CM | POA: Diagnosis not present

## 2023-11-07 DIAGNOSIS — L814 Other melanin hyperpigmentation: Secondary | ICD-10-CM | POA: Diagnosis not present

## 2023-11-07 DIAGNOSIS — L570 Actinic keratosis: Secondary | ICD-10-CM | POA: Diagnosis not present

## 2023-11-13 NOTE — Addendum Note (Signed)
Addended by: Geralyn Flash D on: 11/13/2023 11:40 AM   Modules accepted: Orders

## 2023-11-13 NOTE — Progress Notes (Signed)
 Remote pacemaker transmission.

## 2023-11-29 DIAGNOSIS — I10 Essential (primary) hypertension: Secondary | ICD-10-CM | POA: Diagnosis not present

## 2023-12-03 DIAGNOSIS — G4733 Obstructive sleep apnea (adult) (pediatric): Secondary | ICD-10-CM | POA: Diagnosis not present

## 2023-12-30 DIAGNOSIS — I10 Essential (primary) hypertension: Secondary | ICD-10-CM | POA: Diagnosis not present

## 2024-01-01 ENCOUNTER — Ambulatory Visit (INDEPENDENT_AMBULATORY_CARE_PROVIDER_SITE_OTHER): Payer: Medicare Other

## 2024-01-01 DIAGNOSIS — I495 Sick sinus syndrome: Secondary | ICD-10-CM

## 2024-01-02 LAB — CUP PACEART REMOTE DEVICE CHECK
Battery Remaining Longevity: 150 mo
Battery Remaining Percentage: 100 %
Brady Statistic RA Percent Paced: 98 %
Brady Statistic RV Percent Paced: 6 %
Date Time Interrogation Session: 20250408000100
Implantable Lead Connection Status: 753985
Implantable Lead Connection Status: 753985
Implantable Lead Implant Date: 20140612
Implantable Lead Implant Date: 20140612
Implantable Lead Location: 753859
Implantable Lead Location: 753860
Implantable Lead Model: 4135
Implantable Lead Model: 4136
Implantable Lead Serial Number: 29410465
Implantable Lead Serial Number: 29414571
Implantable Pulse Generator Implant Date: 20240408
Lead Channel Impedance Value: 598 Ohm
Lead Channel Impedance Value: 761 Ohm
Lead Channel Pacing Threshold Amplitude: 0.6 V
Lead Channel Pacing Threshold Amplitude: 2.3 V
Lead Channel Pacing Threshold Pulse Width: 0.4 ms
Lead Channel Pacing Threshold Pulse Width: 0.4 ms
Lead Channel Setting Pacing Amplitude: 2 V
Lead Channel Setting Pacing Amplitude: 2.5 V
Lead Channel Setting Pacing Pulse Width: 0.4 ms
Lead Channel Setting Sensing Sensitivity: 2.5 mV
Pulse Gen Serial Number: 662227
Zone Setting Status: 755011

## 2024-01-10 ENCOUNTER — Ambulatory Visit: Payer: Medicare Other

## 2024-01-29 DIAGNOSIS — I10 Essential (primary) hypertension: Secondary | ICD-10-CM | POA: Diagnosis not present

## 2024-02-19 NOTE — Progress Notes (Signed)
 Remote pacemaker transmission.

## 2024-02-19 NOTE — Addendum Note (Signed)
 Addended by: Lott Rouleau A on: 02/19/2024 09:49 AM   Modules accepted: Orders

## 2024-02-29 DIAGNOSIS — I10 Essential (primary) hypertension: Secondary | ICD-10-CM | POA: Diagnosis not present

## 2024-03-04 DIAGNOSIS — G4733 Obstructive sleep apnea (adult) (pediatric): Secondary | ICD-10-CM | POA: Diagnosis not present

## 2024-03-05 DIAGNOSIS — I10 Essential (primary) hypertension: Secondary | ICD-10-CM | POA: Diagnosis not present

## 2024-03-30 DIAGNOSIS — I10 Essential (primary) hypertension: Secondary | ICD-10-CM | POA: Diagnosis not present

## 2024-04-01 ENCOUNTER — Ambulatory Visit: Payer: Medicare Other

## 2024-04-01 DIAGNOSIS — I495 Sick sinus syndrome: Secondary | ICD-10-CM

## 2024-04-02 LAB — CUP PACEART REMOTE DEVICE CHECK
Battery Remaining Longevity: 156 mo
Battery Remaining Percentage: 100 %
Brady Statistic RA Percent Paced: 99 %
Brady Statistic RV Percent Paced: 9 %
Date Time Interrogation Session: 20250708000600
Implantable Lead Connection Status: 753985
Implantable Lead Connection Status: 753985
Implantable Lead Implant Date: 20140612
Implantable Lead Implant Date: 20140612
Implantable Lead Location: 753859
Implantable Lead Location: 753860
Implantable Lead Model: 4135
Implantable Lead Model: 4136
Implantable Lead Serial Number: 29410465
Implantable Lead Serial Number: 29414571
Implantable Pulse Generator Implant Date: 20240408
Lead Channel Impedance Value: 611 Ohm
Lead Channel Impedance Value: 755 Ohm
Lead Channel Pacing Threshold Amplitude: 0.5 V
Lead Channel Pacing Threshold Amplitude: 1.8 V
Lead Channel Pacing Threshold Pulse Width: 0.4 ms
Lead Channel Pacing Threshold Pulse Width: 0.4 ms
Lead Channel Setting Pacing Amplitude: 2 V
Lead Channel Setting Pacing Amplitude: 2.5 V
Lead Channel Setting Pacing Pulse Width: 0.4 ms
Lead Channel Setting Sensing Sensitivity: 2.5 mV
Pulse Gen Serial Number: 662227
Zone Setting Status: 755011

## 2024-04-03 ENCOUNTER — Ambulatory Visit: Payer: Self-pay | Admitting: Internal Medicine

## 2024-04-07 ENCOUNTER — Other Ambulatory Visit: Payer: Self-pay | Admitting: Internal Medicine

## 2024-04-09 ENCOUNTER — Other Ambulatory Visit: Payer: Self-pay

## 2024-04-09 DIAGNOSIS — I480 Paroxysmal atrial fibrillation: Secondary | ICD-10-CM

## 2024-04-09 MED ORDER — RIVAROXABAN 20 MG PO TABS: ORAL_TABLET | ORAL | 1 refills | Status: DC

## 2024-04-09 NOTE — Telephone Encounter (Signed)
 Prescription refill request for Xarelto  received.  Indication:afib Last office visit:7/24 Weight:61.7  kg Age:78 Scr:0.95  1/25 CrCl:56.83  ml/min  Prescription refilled

## 2024-04-10 ENCOUNTER — Ambulatory Visit: Payer: Medicare Other

## 2024-04-14 DIAGNOSIS — I495 Sick sinus syndrome: Secondary | ICD-10-CM | POA: Diagnosis not present

## 2024-04-14 DIAGNOSIS — I4891 Unspecified atrial fibrillation: Secondary | ICD-10-CM | POA: Diagnosis not present

## 2024-04-14 DIAGNOSIS — Z79899 Other long term (current) drug therapy: Secondary | ICD-10-CM | POA: Diagnosis not present

## 2024-04-14 DIAGNOSIS — E782 Mixed hyperlipidemia: Secondary | ICD-10-CM | POA: Diagnosis not present

## 2024-04-14 DIAGNOSIS — Z23 Encounter for immunization: Secondary | ICD-10-CM | POA: Diagnosis not present

## 2024-04-14 DIAGNOSIS — I10 Essential (primary) hypertension: Secondary | ICD-10-CM | POA: Diagnosis not present

## 2024-04-28 DIAGNOSIS — E875 Hyperkalemia: Secondary | ICD-10-CM | POA: Diagnosis not present

## 2024-04-30 DIAGNOSIS — I10 Essential (primary) hypertension: Secondary | ICD-10-CM | POA: Diagnosis not present

## 2024-05-07 ENCOUNTER — Other Ambulatory Visit: Payer: Self-pay | Admitting: Internal Medicine

## 2024-05-30 DIAGNOSIS — I10 Essential (primary) hypertension: Secondary | ICD-10-CM | POA: Diagnosis not present

## 2024-06-03 DIAGNOSIS — G4733 Obstructive sleep apnea (adult) (pediatric): Secondary | ICD-10-CM | POA: Diagnosis not present

## 2024-06-29 DIAGNOSIS — I10 Essential (primary) hypertension: Secondary | ICD-10-CM | POA: Diagnosis not present

## 2024-07-01 ENCOUNTER — Ambulatory Visit: Payer: Medicare Other

## 2024-07-01 DIAGNOSIS — I480 Paroxysmal atrial fibrillation: Secondary | ICD-10-CM

## 2024-07-02 LAB — CUP PACEART REMOTE DEVICE CHECK
Battery Remaining Longevity: 150 mo
Battery Remaining Percentage: 100 %
Brady Statistic RA Percent Paced: 99 %
Brady Statistic RV Percent Paced: 11 %
Date Time Interrogation Session: 20251007000200
Implantable Lead Connection Status: 753985
Implantable Lead Connection Status: 753985
Implantable Lead Implant Date: 20140612
Implantable Lead Implant Date: 20140612
Implantable Lead Location: 753859
Implantable Lead Location: 753860
Implantable Lead Model: 4135
Implantable Lead Model: 4136
Implantable Lead Serial Number: 29410465
Implantable Lead Serial Number: 29414571
Implantable Pulse Generator Implant Date: 20240408
Lead Channel Impedance Value: 601 Ohm
Lead Channel Impedance Value: 761 Ohm
Lead Channel Pacing Threshold Amplitude: 0.5 V
Lead Channel Pacing Threshold Amplitude: 1.9 V
Lead Channel Pacing Threshold Pulse Width: 0.4 ms
Lead Channel Pacing Threshold Pulse Width: 0.4 ms
Lead Channel Setting Pacing Amplitude: 2 V
Lead Channel Setting Pacing Amplitude: 2.5 V
Lead Channel Setting Pacing Pulse Width: 0.4 ms
Lead Channel Setting Sensing Sensitivity: 2.5 mV
Pulse Gen Serial Number: 662227
Zone Setting Status: 755011

## 2024-07-03 ENCOUNTER — Ambulatory Visit: Payer: Self-pay | Admitting: Internal Medicine

## 2024-07-04 NOTE — Progress Notes (Signed)
 Remote PPM Transmission

## 2024-07-06 ENCOUNTER — Other Ambulatory Visit: Payer: Self-pay | Admitting: Internal Medicine

## 2024-07-15 DIAGNOSIS — Z23 Encounter for immunization: Secondary | ICD-10-CM | POA: Diagnosis not present

## 2024-07-29 DIAGNOSIS — I10 Essential (primary) hypertension: Secondary | ICD-10-CM | POA: Diagnosis not present

## 2024-08-05 ENCOUNTER — Other Ambulatory Visit: Payer: Self-pay | Admitting: Internal Medicine

## 2024-08-12 ENCOUNTER — Other Ambulatory Visit: Payer: Self-pay | Admitting: Internal Medicine

## 2024-08-12 ENCOUNTER — Telehealth: Payer: Self-pay | Admitting: Internal Medicine

## 2024-08-12 MED ORDER — FLECAINIDE ACETATE 100 MG PO TABS: 100.0000 mg | ORAL_TABLET | Freq: Two times a day (BID) | ORAL | 1 refills | Status: DC

## 2024-08-12 NOTE — Telephone Encounter (Signed)
*  STAT* If patient is at the pharmacy, call can be transferred to refill team.   1. Which medications need to be refilled? (please list name of each medication and dose if known) flecainide  (TAMBOCOR ) 100 MG tablet   2. Which pharmacy/location (including street and city if local pharmacy) is medication to be sent to? [facility] - [place], [place] - [place], [place] - [address]   3. Do they need a 30 day or 90 day supply? 90   Patient has appt on 12/31

## 2024-08-12 NOTE — Telephone Encounter (Signed)
 Pt's medication was sent to pt's pharmacy as requested. Confirmation received.

## 2024-08-28 DIAGNOSIS — I10 Essential (primary) hypertension: Secondary | ICD-10-CM | POA: Diagnosis not present

## 2024-09-02 DIAGNOSIS — G4733 Obstructive sleep apnea (adult) (pediatric): Secondary | ICD-10-CM | POA: Diagnosis not present

## 2024-09-23 ENCOUNTER — Other Ambulatory Visit: Payer: Self-pay | Admitting: Internal Medicine

## 2024-09-23 DIAGNOSIS — I480 Paroxysmal atrial fibrillation: Secondary | ICD-10-CM

## 2024-09-24 ENCOUNTER — Ambulatory Visit: Admitting: Internal Medicine

## 2024-09-24 ENCOUNTER — Encounter: Payer: Self-pay | Admitting: Internal Medicine

## 2024-09-24 VITALS — BP 128/70 | Ht 62.0 in | Wt 143.9 lb

## 2024-09-24 DIAGNOSIS — I495 Sick sinus syndrome: Secondary | ICD-10-CM

## 2024-09-24 DIAGNOSIS — Z950 Presence of cardiac pacemaker: Secondary | ICD-10-CM | POA: Diagnosis not present

## 2024-09-24 DIAGNOSIS — I480 Paroxysmal atrial fibrillation: Secondary | ICD-10-CM | POA: Diagnosis not present

## 2024-09-24 LAB — CUP PACEART INCLINIC DEVICE CHECK
Date Time Interrogation Session: 20251231212957
Implantable Lead Connection Status: 753985
Implantable Lead Connection Status: 753985
Implantable Lead Implant Date: 20140612
Implantable Lead Implant Date: 20140612
Implantable Lead Location: 753859
Implantable Lead Location: 753860
Implantable Lead Model: 4135
Implantable Lead Model: 4136
Implantable Lead Serial Number: 29410465
Implantable Lead Serial Number: 29414571
Implantable Pulse Generator Implant Date: 20240408
Lead Channel Impedance Value: 630 Ohm
Lead Channel Impedance Value: 768 Ohm
Lead Channel Pacing Threshold Amplitude: 0.9 V
Lead Channel Pacing Threshold Amplitude: 1.7 V
Lead Channel Pacing Threshold Amplitude: 2.1 V
Lead Channel Pacing Threshold Pulse Width: 0.4 ms
Lead Channel Pacing Threshold Pulse Width: 0.4 ms
Lead Channel Pacing Threshold Pulse Width: 0.8 ms
Lead Channel Sensing Intrinsic Amplitude: 14.8 mV
Lead Channel Sensing Intrinsic Amplitude: 3.5 mV
Lead Channel Setting Pacing Amplitude: 2 V
Lead Channel Setting Pacing Amplitude: 3.5 V
Lead Channel Setting Pacing Pulse Width: 0.4 ms
Lead Channel Setting Sensing Sensitivity: 2.5 mV
Pulse Gen Serial Number: 662227
Zone Setting Status: 755011

## 2024-09-24 MED ORDER — FLECAINIDE ACETATE 100 MG PO TABS: 100.0000 mg | ORAL_TABLET | Freq: Two times a day (BID) | ORAL | 11 refills | Status: AC

## 2024-09-24 MED ORDER — METOPROLOL TARTRATE 100 MG PO TABS: 100.0000 mg | ORAL_TABLET | Freq: Two times a day (BID) | ORAL | 11 refills | Status: AC

## 2024-09-24 MED ORDER — RIVAROXABAN 20 MG PO TABS: 20.0000 mg | ORAL_TABLET | Freq: Every day | ORAL | 3 refills | Status: AC

## 2024-09-24 NOTE — Progress Notes (Signed)
 "     HPI Patrick Mccall returns today for followup. He is a pleasant 78 yo man with PAF, symptomatic tachy-brady, s/p PPM insertion. He denies sob or chest pain. Minimal palpitations. No edema.  Allergies[1]   Current Outpatient Medications  Medication Sig Dispense Refill   acetaminophen  (TYLENOL ) 500 MG tablet Take 500 mg by mouth every 6 (six) hours as needed for pain.     flecainide  (TAMBOCOR ) 100 MG tablet Take 1 tablet (100 mg total) by mouth 2 (two) times daily. 60 tablet 1   losartan (COZAAR) 100 MG tablet Take 100 mg by mouth daily.     metoprolol  tartrate (LOPRESSOR ) 100 MG tablet TAKE ONE TABLET BY MOUTH TWICE DAILY 60 tablet 1   Omega-3 Fatty Acids (FISH OIL) 1000 MG CAPS Take 1 capsule by mouth daily.     simvastatin  (ZOCOR ) 40 MG tablet Take 40 mg by mouth at bedtime.     XARELTO  20 MG TABS tablet TAKE ONE TABLET BY MOUTH DAILY WITH SUPPER 90 tablet 1   Multiple Vitamins-Minerals (MULTIVITAMIN WITH MINERALS) tablet Take 1 tablet by mouth daily.     No current facility-administered medications for this visit.     Past Medical History:  Diagnosis Date   Factor V Leiden deficiency    HLD (hyperlipidemia)    Hypertension    PAF (paroxysmal atrial fibrillation) (HCC)    aflutter ablation 02/2013, pacer implant   Sleep apnea     ROS:   All systems reviewed and negative except as noted in the HPI.   Past Surgical History:  Procedure Laterality Date   ATRIAL FLUTTER ABLATION N/A 03/06/2013   Procedure: ATRIAL FLUTTER ABLATION;  Surgeon: Danelle LELON Birmingham, MD;  Location: [facility] CATH LAB;  Service: Cardiovascular;  Laterality: N/A;   CARDIAC CATHETERIZATION  02/2013   nl coronaries   HERNIA REPAIR     LEFT HEART CATHETERIZATION WITH CORONARY ANGIOGRAM N/A 03/04/2013   Procedure: LEFT HEART CATHETERIZATION WITH CORONARY ANGIOGRAM;  Surgeon: Dorn JINNY Lesches, MD;  Location: [facility] CATH LAB;  Service: Cardiovascular;  Laterality: N/A;   PACEMAKER INSERTION     BS, pauses and sinus  brady   PERMANENT PACEMAKER INSERTION N/A 03/06/2013   Procedure: PERMANENT PACEMAKER INSERTION;  Surgeon: Danelle LELON Birmingham, MD;  Location: [facility] CATH LAB;  Service: Cardiovascular;  Laterality: N/A;   PPM GENERATOR CHANGEOUT N/A 01/01/2023   Procedure: PPM GENERATOR CHANGEOUT;  Surgeon: Birmingham Danelle LELON, MD;  Location: [facility] INVASIVE CV LAB;  Service: Cardiovascular;  Laterality: N/A;     Family History  Problem Relation Age of Onset   Heart attack Mother    Heart attack Father    Heart attack Maternal Grandfather    Heart attack Maternal Grandmother    Heart attack Paternal Grandfather    Heart attack Paternal Grandmother      Social History   Socioeconomic History   Marital status: Married    Spouse name: Not on file   Number of children: Not on file   Years of education: Not on file   Highest education level: Not on file  Occupational History   Not on file  Tobacco Use   Smoking status: Never   Smokeless tobacco: Never  Substance and Sexual Activity   Alcohol use: Yes    Alcohol/week: 1.0 - 2.0 standard drink of alcohol    Types: 1 - 2 Standard drinks or equivalent per week   Drug use: No   Sexual activity: Not on file  Other Topics  Concern   Not on file  Social History Narrative   Exercises five times per week doing cardio.   Social Drivers of Health   Tobacco Use: Low Risk (09/24/2024)   Patient History    Smoking Tobacco Use: Never    Smokeless Tobacco Use: Never    Passive Exposure: Not on file  Financial Resource Strain: Not on file  Food Insecurity: Not on file  Transportation Needs: Not on file  Physical Activity: Not on file  Stress: Not on file  Social Connections: Not on file  Intimate Partner Violence: Not on file  Depression (EYV7-0): Not on file  Alcohol Screen: Not on file  Housing: Not on file  Utilities: Not on file  Health Literacy: Not on file     BP 128/70 (BP Location: Right Arm, Patient Position: Sitting, Cuff Size: Normal)   Ht 5' 2  (1.575 m)   Wt 143 lb 14.4 oz (65.3 kg)   SpO2 96%   BMI 26.32 kg/m   Physical Exam:  Well appearing NAD HEENT: Unremarkable Neck:  No JVD, no thyromegally Lymphatics:  No adenopathy Back:  No CVA tenderness Lungs:  Clear HEART:  Regular rate rhythm, no murmurs, no rubs, no clicks Abd:  soft, positive bowel sounds, no organomegally, no rebound, no guarding Ext:  2 plus pulses, no edema, no cyanosis, no clubbing Skin:  No rashes no nodules Neuro:  CN II through XII intact, motor grossly intact  EKG - NSR with atrial pacing  DEVICE  Normal device function.  See PaceArt for details.   Assess/Plan:  Sinus node dysfunction - he is asymptomatic s/p [place] Sci PPM insertion. He has undergone PPM gen change out and is doing well. PAF - he will continue flecainide  100 mg twice daily. No atrial fib. HTN - his sbp is up a bit. He is encouraged to continue his current meds and maintain a low sodium diet. Coags - he has had no trouble with xarelto  and no bleeding   Patrick Amsler,MD    [1] No Known Allergies  "

## 2024-09-24 NOTE — Patient Instructions (Signed)
 Medication Instructions:  Your physician recommends that you continue on your current medications as directed. Please refer to the Current Medication list given to you today.  *If you need a refill on your cardiac medications before your next appointment, please call your pharmacy*  Lab Work: None ordered.  You may go to any [facility] Location for your lab work:  [facility] - [address] ([facility]) - [phone] [address]  [place] - [address]   [place]  - [address]   [place] - [address] - [address] [place]  - [address] ([facility]   If you have labs (blood work) drawn today and your tests are completely normal, you will receive your results only by: [employer] (if you have MyChart)  If you have any lab test that is abnormal or we need to change your treatment, we will call you or send a MyChart message to review the results.  Testing/Procedures: None ordered.  Follow-Up: At [facility], you and your health needs are our priority.  As part of our continuing mission to provide you with exceptional heart care, we have created designated Provider Care Teams.  These Care Teams include your primary Cardiologist (physician) and Advanced Practice Providers (APPs -  Physician Assistants and Nurse Practitioners) who all work together to provide you with the care you need, when you need it.  Your next appointment:   1 year(s)

## 2024-09-30 ENCOUNTER — Ambulatory Visit: Payer: Medicare Other

## 2024-09-30 DIAGNOSIS — I480 Paroxysmal atrial fibrillation: Secondary | ICD-10-CM

## 2024-10-01 LAB — CUP PACEART REMOTE DEVICE CHECK
Battery Remaining Longevity: 132 mo
Battery Remaining Percentage: 100 %
Brady Statistic RA Percent Paced: 99 %
Brady Statistic RV Percent Paced: 14 %
Date Time Interrogation Session: 20260106000100
Implantable Lead Connection Status: 753985
Implantable Lead Connection Status: 753985
Implantable Lead Implant Date: 20140612
Implantable Lead Implant Date: 20140612
Implantable Lead Location: 753859
Implantable Lead Location: 753860
Implantable Lead Model: 4135
Implantable Lead Model: 4136
Implantable Lead Serial Number: 29410465
Implantable Lead Serial Number: 29414571
Implantable Pulse Generator Implant Date: 20240408
Lead Channel Impedance Value: 611 Ohm
Lead Channel Impedance Value: 745 Ohm
Lead Channel Pacing Threshold Amplitude: 0.6 V
Lead Channel Pacing Threshold Amplitude: 2.4 V
Lead Channel Pacing Threshold Pulse Width: 0.4 ms
Lead Channel Pacing Threshold Pulse Width: 0.4 ms
Lead Channel Setting Pacing Amplitude: 2 V
Lead Channel Setting Pacing Amplitude: 3.5 V
Lead Channel Setting Pacing Pulse Width: 0.4 ms
Lead Channel Setting Sensing Sensitivity: 2.5 mV
Pulse Gen Serial Number: 662227
Zone Setting Status: 755011

## 2024-10-02 ENCOUNTER — Ambulatory Visit: Payer: Self-pay | Admitting: Cardiology

## 2024-10-03 NOTE — Progress Notes (Signed)
 Remote PPM Transmission
